# Patient Record
Sex: Female | Born: 1970 | Race: White | Hispanic: No | Marital: Married | Smoking: Never smoker
Health system: Southern US, Community
[De-identification: ages and names within clinical notes are randomized; demographics above are authoritative.]

## PROBLEM LIST (undated history)

## (undated) DIAGNOSIS — G43909 Migraine, unspecified, not intractable, without status migrainosus: Secondary | ICD-10-CM

## (undated) DIAGNOSIS — J45909 Unspecified asthma, uncomplicated: Secondary | ICD-10-CM

## (undated) DIAGNOSIS — M1990 Unspecified osteoarthritis, unspecified site: Secondary | ICD-10-CM

## (undated) DIAGNOSIS — F32A Depression, unspecified: Secondary | ICD-10-CM

## (undated) DIAGNOSIS — F329 Major depressive disorder, single episode, unspecified: Secondary | ICD-10-CM

## (undated) HISTORY — DX: Unspecified asthma, uncomplicated: J45.909

## (undated) HISTORY — PX: OTHER SURGICAL HISTORY: SHX169

## (undated) HISTORY — PX: AUGMENTATION MAMMAPLASTY: SUR837

## (undated) HISTORY — DX: Migraine, unspecified, not intractable, without status migrainosus: G43.909

## (undated) HISTORY — PX: KNEE SURGERY: SHX244

---
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
## 1994-07-23 HISTORY — PX: DILATION AND CURETTAGE OF UTERUS: SHX78

## 1997-11-03 ENCOUNTER — Inpatient Hospital Stay (HOSPITAL_COMMUNITY): Admission: AD | Admit: 1997-11-03 | Discharge: 1997-11-06 | Payer: Self-pay | Admitting: Gynecology

## 1998-12-28 ENCOUNTER — Other Ambulatory Visit: Admission: RE | Admit: 1998-12-28 | Discharge: 1998-12-28 | Payer: Self-pay | Admitting: Obstetrics and Gynecology

## 2000-02-01 ENCOUNTER — Other Ambulatory Visit: Admission: RE | Admit: 2000-02-01 | Discharge: 2000-02-01 | Payer: Self-pay | Admitting: Gynecology

## 2001-02-25 ENCOUNTER — Other Ambulatory Visit: Admission: RE | Admit: 2001-02-25 | Discharge: 2001-02-25 | Payer: Self-pay | Admitting: Obstetrics and Gynecology

## 2001-07-22 ENCOUNTER — Other Ambulatory Visit: Admission: RE | Admit: 2001-07-22 | Discharge: 2001-07-22 | Payer: Self-pay | Admitting: Gynecology

## 2001-07-23 HISTORY — PX: ANKLE SURGERY: SHX546

## 2002-03-26 ENCOUNTER — Other Ambulatory Visit: Admission: RE | Admit: 2002-03-26 | Discharge: 2002-03-26 | Payer: Self-pay | Admitting: Gynecology

## 2002-12-31 ENCOUNTER — Other Ambulatory Visit: Admission: RE | Admit: 2002-12-31 | Discharge: 2002-12-31 | Payer: Self-pay | Admitting: Gynecology

## 2003-07-28 ENCOUNTER — Inpatient Hospital Stay (HOSPITAL_COMMUNITY): Admission: AD | Admit: 2003-07-28 | Discharge: 2003-07-30 | Payer: Self-pay | Admitting: Gynecology

## 2003-09-08 ENCOUNTER — Other Ambulatory Visit: Admission: RE | Admit: 2003-09-08 | Discharge: 2003-09-08 | Payer: Self-pay | Admitting: Gynecology

## 2004-12-04 ENCOUNTER — Other Ambulatory Visit: Admission: RE | Admit: 2004-12-04 | Discharge: 2004-12-04 | Payer: Self-pay | Admitting: Gynecology

## 2005-06-27 ENCOUNTER — Other Ambulatory Visit: Admission: RE | Admit: 2005-06-27 | Discharge: 2005-06-27 | Payer: Self-pay | Admitting: Gynecology

## 2006-03-12 ENCOUNTER — Other Ambulatory Visit: Admission: RE | Admit: 2006-03-12 | Discharge: 2006-03-12 | Payer: Self-pay | Admitting: Gynecology

## 2007-03-25 ENCOUNTER — Other Ambulatory Visit: Admission: RE | Admit: 2007-03-25 | Discharge: 2007-03-25 | Payer: Self-pay | Admitting: Gynecology

## 2008-05-11 ENCOUNTER — Encounter: Payer: Self-pay | Admitting: Gynecology

## 2008-05-11 ENCOUNTER — Ambulatory Visit: Payer: Self-pay | Admitting: Gynecology

## 2008-05-11 ENCOUNTER — Other Ambulatory Visit: Admission: RE | Admit: 2008-05-11 | Discharge: 2008-05-11 | Payer: Self-pay | Admitting: Gynecology

## 2008-06-03 ENCOUNTER — Ambulatory Visit: Payer: Self-pay | Admitting: Gynecology

## 2008-06-07 ENCOUNTER — Ambulatory Visit: Payer: Self-pay | Admitting: Gynecology

## 2008-06-14 ENCOUNTER — Ambulatory Visit: Payer: Self-pay | Admitting: Gynecology

## 2008-06-21 ENCOUNTER — Ambulatory Visit: Payer: Self-pay | Admitting: Gynecology

## 2009-02-06 ENCOUNTER — Inpatient Hospital Stay (HOSPITAL_COMMUNITY): Admission: AD | Admit: 2009-02-06 | Discharge: 2009-02-08 | Payer: Self-pay | Admitting: Obstetrics and Gynecology

## 2010-10-29 LAB — CBC
HCT: 31.8 % — ABNORMAL LOW (ref 36.0–46.0)
MCHC: 33.5 g/dL (ref 30.0–36.0)
MCV: 92.3 fL (ref 78.0–100.0)
Platelets: 214 10*3/uL (ref 150–400)
Platelets: 233 10*3/uL (ref 150–400)
RBC: 3.75 MIL/uL — ABNORMAL LOW (ref 3.87–5.11)
RDW: 13.8 % (ref 11.5–15.5)
RDW: 13.8 % (ref 11.5–15.5)
WBC: 11.1 10*3/uL — ABNORMAL HIGH (ref 4.0–10.5)

## 2010-10-29 LAB — RPR: RPR Ser Ql: NONREACTIVE

## 2010-12-08 NOTE — Discharge Summary (Signed)
Colleen Sloan, Colleen Sloan                      ACCOUNT NO.:  [id number]   MEDICAL RECORD NO.:  [id number]                   PATIENT TYPE:  INP   LOCATION:  9121                                 FACILITY:  [facility]   PHYSICIAN:  Ivor Costa. Farrel Gobble, M.D.              DATE OF BIRTH:  Feb 16, 1971   DATE OF ADMISSION:  07/28/2003  DATE OF DISCHARGE:  07/30/2003                                 DISCHARGE SUMMARY   DISCHARGE DIAGNOSES:  1. Intrauterine pregnancy at 40+ weeks, delivered.  2. Rh negative.  3. Status post spontaneous vaginal delivery.   HISTORY:  This is a 32-years-of-age female gravida 4 para 1 aborta 2 with an  EDC of July 26, 2003.  Prenatal course had been complicated by Rh  negative; the received RhoGAM.   HOSPITAL COURSE:  On July 28, 2003 the patient presented at 40+ weeks in  labor.  Artificial rupture of membranes performed which revealed clear  fluid.  Labor was augmented with Pitocin and on July 28, 2003 the patient  underwent an spontaneous vaginal delivery of a female, Apgars of 8 and 9,  weight of 7 pounds 11 ounces.  There was noted to be a nuchal cord x2 which  was tight.  There was no episiotomy.  There was a second degree midline  laceration as well as bilateral small periurethral/inner labial lacerations  all of which were repaired without complication.  Postpartum the patient  remained afebrile, voiding, in stable condition, and was discharged to home  on July 30, 2003 and given [facility] postpartum instructions.   ACCESSORY CLINICAL FINDINGS/LABORATORY DATA:  The patient is O negative,  rubella immune.  On July 29, 2003 hemoglobin 11.9.   DISPOSITION:  The patient is discharged to home on July 30, 2003 and given  [facility] postpartum instructions and postpartum booklet.  She  had been given RhoGAM prior to discharge.     Susa Loffler, P.A.                    Ivor Costa. Farrel Gobble, M.D.    Ardath Sax  D:  08/30/2003  T:   08/30/2003  Job:  [id number]

## 2011-09-17 ENCOUNTER — Encounter: Payer: Self-pay | Admitting: Gynecology

## 2011-09-17 ENCOUNTER — Ambulatory Visit (INDEPENDENT_AMBULATORY_CARE_PROVIDER_SITE_OTHER): Payer: BC Managed Care – PPO | Admitting: Gynecology

## 2011-09-17 ENCOUNTER — Other Ambulatory Visit (HOSPITAL_COMMUNITY)
Admission: RE | Admit: 2011-09-17 | Discharge: 2011-09-17 | Disposition: A | Discharge status: Home / Self Care | Payer: BC Managed Care – PPO | Source: Ambulatory Visit | Attending: Gynecology | Admitting: Gynecology

## 2011-09-17 VITALS — BP 110/76 | Ht 64.5 in | Wt 164.0 lb

## 2011-09-17 DIAGNOSIS — Z131 Encounter for screening for diabetes mellitus: Secondary | ICD-10-CM

## 2011-09-17 DIAGNOSIS — R5381 Other malaise: Secondary | ICD-10-CM

## 2011-09-17 DIAGNOSIS — R5383 Other fatigue: Secondary | ICD-10-CM

## 2011-09-17 DIAGNOSIS — Z01419 Encounter for gynecological examination (general) (routine) without abnormal findings: Secondary | ICD-10-CM

## 2011-09-17 DIAGNOSIS — G43909 Migraine, unspecified, not intractable, without status migrainosus: Secondary | ICD-10-CM

## 2011-09-17 DIAGNOSIS — Z13220 Encounter for screening for lipoid disorders: Secondary | ICD-10-CM

## 2011-09-17 MED ORDER — FLUOXETINE HCL 20 MG PO CAPS: 20.0000 mg | ORAL_CAPSULE | Freq: Every day | ORAL | Status: DC

## 2011-09-17 NOTE — Patient Instructions (Signed)
Start fluoxetine 20 mg daily.  Call if you have any issues. Otherwise follow up in one year for annual gynecologic exam.

## 2011-09-17 NOTE — Progress Notes (Signed)
   Colleen Sloan 1970-09-14 [id number]        41 y.o.  G5 P3 Ab2 Mirena IUD birth control for annual exam. Complaining of increased fatigue and stress. Apparently has a very stressful job and is really affecting the way she feels. Also being followed for migraines and on several medications for this by her primary.   Past medical history,surgical history, medications, allergies, family history and social history were all reviewed and documented in the EPIC chart. ROS:  Was performed and pertinent positives and negatives are included in the history.  Exam: Sherrilyn Rist chaperone present Filed Vitals:   09/17/11 1228  BP: 110/76   General appearance  Normal Skin grossly normal Head/Neck normal with no cervical or supraclavicular adenopathy thyroid normal Lungs  clear Cardiac RR, without RMG Abdominal  soft, nontender, without masses, organomegaly or hernia Breasts  examined lying and sitting without masses, retractions, discharge or axillary adenopathy. Pelvic  Ext/BUS/vagina  normal   Cervix  normal  Pap done, IUD string visualized  Uterus  anteverted, normal size, shape and contour, midline and mobile nontender   Adnexa  Without masses or tenderness    Anus and perineum  normal   Rectovaginal  normal sphincter tone without palpated masses or tenderness.    Assessment/Plan:  41 y.o. female for annual exam.    1. Stress/fatigue. I think it is situational in her life. We'll check baseline TSH. Options for management were reviewed and she wants to try an anxiolytic. Fluoxetine 20 mg daily refill times a year prescribed. Side effect profile/suicide ideation risks reviewed. Patient accepts. She will let me know if this doesn't seem to help for dosage adjustment or trying a different medication. 2. Mirena IUD. This was placed fall 2010. She is amenorrheic doing well with this and will continue it through the 5 year course. 3. Migraine headaches. She is on several medications for this wants to  see a neurologist. I suggested Dr. Vela Prose and we will help facilitate this. 4. Pap smear. Pap smear was done today. She has no history of significant abnormal Pap smears in the past but we will discuss a less frequent screening next year. 5. Breast health. SBE monthly reviewed. Screening mammographic recommendations between 35 and 40 were reviewed and she wants to go ahead and schedule this and [facility]. 6. Health maintenance. Will check baseline CBC lipid profile urinalysis along with her TSH. I'm also going to check a comp has metabolic panel as she is on medications that may affect liver/renal function and will check this also. Otherwise assuming she continues well but she will see [place] in a year sooner as needed.    Dara Lords MD, 12:54 PM 09/17/2011

## 2011-09-18 LAB — URINALYSIS W MICROSCOPIC + REFLEX CULTURE
Casts: NONE SEEN
Crystals: NONE SEEN
Nitrite: NEGATIVE
Specific Gravity, Urine: 1.028 (ref 1.005–1.030)
Urobilinogen, UA: 0.2 mg/dL (ref 0.0–1.0)
pH: 6.5 (ref 5.0–8.0)

## 2011-09-18 LAB — CBC WITH DIFFERENTIAL/PLATELET
Basophils Relative: 0 % (ref 0–1)
HCT: 40.8 % (ref 36.0–46.0)
Hemoglobin: 13.1 g/dL (ref 12.0–15.0)
Lymphocytes Relative: 24 % (ref 12–46)
Monocytes Absolute: 0.4 10*3/uL (ref 0.1–1.0)
Monocytes Relative: 7 % (ref 3–12)
Neutro Abs: 4.1 10*3/uL (ref 1.7–7.7)
Neutrophils Relative %: 68 % (ref 43–77)
RBC: 4.31 MIL/uL (ref 3.87–5.11)
WBC: 6 10*3/uL (ref 4.0–10.5)

## 2011-09-18 LAB — COMPREHENSIVE METABOLIC PANEL
AST: 17 U/L (ref 0–37)
Albumin: 4.7 g/dL (ref 3.5–5.2)
Alkaline Phosphatase: 62 U/L (ref 39–117)
BUN: 14 mg/dL (ref 6–23)
Calcium: 9.4 mg/dL (ref 8.4–10.5)
Chloride: 104 mEq/L (ref 96–112)
Glucose, Bld: 86 mg/dL (ref 70–99)
Potassium: 4 mEq/L (ref 3.5–5.3)
Sodium: 137 mEq/L (ref 135–145)
Total Protein: 7.2 g/dL (ref 6.0–8.3)

## 2011-09-18 LAB — LIPID PANEL
HDL: 50 mg/dL (ref >39)
LDL Cholesterol: 101 mg/dL — ABNORMAL HIGH (ref 0–99)
Triglycerides: 56 mg/dL (ref <150)

## 2011-09-18 LAB — TSH: TSH: 2.076 u[IU]/mL (ref 0.350–4.500)

## 2011-10-08 ENCOUNTER — Telehealth: Payer: Self-pay | Admitting: *Deleted

## 2011-10-08 NOTE — Telephone Encounter (Signed)
Pt called just recently found out her dad has been diagnosed with genetic disorder hemochromatosis. Her father's doctor suggested that she get tested as well. Pt is still feeling very fatigue and no energy as noted on last OV in feb 2013. Pt didn't know if this is something you could check for or if you have any recommendations of who she should see regarding this? Please advise

## 2011-10-11 NOTE — Telephone Encounter (Signed)
Pt informed with the below note and will call back with the labs to be ordered.

## 2011-10-11 NOTE — Telephone Encounter (Signed)
There are several ways to test for hemochromatosis to include iron studies versus genetic gene testing. I would suggest the patient contact her father's doctor and ask what tests they recommend that she have done and we can order that for her I am really not an expert on hemochromatosis and I prefer if the doctor diagnosed her father with suggest the testing needed.

## 2012-03-31 ENCOUNTER — Telehealth: Payer: Self-pay | Admitting: *Deleted

## 2012-03-31 MED ORDER — VENLAFAXINE HCL ER 75 MG PO CP24: 75.0000 mg | ORAL_CAPSULE | Freq: Every day | ORAL | Status: DC

## 2012-03-31 NOTE — Telephone Encounter (Signed)
Pt informed with the below note. 

## 2012-03-31 NOTE — Telephone Encounter (Signed)
Pt was given Rx for gen. Prozac 20 mg, pt said that she still has mood swings, slight depression and anxiety. Pt also has noticed weight gain on medication. Pt asked if another Rx. Please advise

## 2012-03-31 NOTE — Telephone Encounter (Signed)
Recommend we switch to Effexor XR 75 mg and we'll see how she does with this.  I would stop the Prozac and start the Effexor the next day.

## 2012-04-12 ENCOUNTER — Ambulatory Visit: Payer: Self-pay | Admitting: Orthopedic Surgery

## 2014-02-22 ENCOUNTER — Telehealth: Payer: Self-pay | Admitting: Gynecology

## 2014-02-22 NOTE — Telephone Encounter (Signed)
02/22/14-LM VM on pt cell that her [facility] insurance covers the removal of old Mirena and insertion of new at 100%, no copay. Pt will call to make An appt.wl

## 2014-02-23 ENCOUNTER — Other Ambulatory Visit: Payer: Self-pay | Admitting: Gynecology

## 2014-02-23 DIAGNOSIS — Z3049 Encounter for surveillance of other contraceptives: Secondary | ICD-10-CM

## 2014-02-23 MED ORDER — LEVONORGESTREL 20 MCG/24HR IU IUD: INTRAUTERINE_SYSTEM | Freq: Once | INTRAUTERINE | Status: AC

## 2014-03-16 ENCOUNTER — Ambulatory Visit: Payer: 59 | Admitting: Gynecology

## 2014-03-31 ENCOUNTER — Telehealth: Payer: Self-pay

## 2014-03-31 ENCOUNTER — Other Ambulatory Visit: Payer: Self-pay | Admitting: Gynecology

## 2014-03-31 MED ORDER — ALPRAZOLAM 0.25 MG PO TABS: ORAL_TABLET | ORAL | Status: DC

## 2014-03-31 NOTE — Telephone Encounter (Signed)
Okay for Xanax 0.25 mg #5 one to 2 by mouth one hour before procedure

## 2014-03-31 NOTE — Telephone Encounter (Signed)
error 

## 2014-03-31 NOTE — Telephone Encounter (Signed)
Patient advised to have someone drive her to and from. She said husband will be coming with her and will be driving. Rx sent.

## 2014-03-31 NOTE — Telephone Encounter (Signed)
Patient is coming in Friday for removal of IUD and insertion of new one. Very anxious about it as she has heard stories of how painful it can be. She wondered if you would prescribed something for her to take for anxiety prior to appointment?

## 2014-04-02 ENCOUNTER — Ambulatory Visit (INDEPENDENT_AMBULATORY_CARE_PROVIDER_SITE_OTHER): Payer: 59 | Admitting: Gynecology

## 2014-04-02 ENCOUNTER — Encounter: Payer: Self-pay | Admitting: Gynecology

## 2014-04-02 DIAGNOSIS — Z30430 Encounter for insertion of intrauterine contraceptive device: Secondary | ICD-10-CM

## 2014-04-02 HISTORY — PX: INTRAUTERINE DEVICE INSERTION: SHX323

## 2014-04-02 NOTE — Progress Notes (Signed)
Patient presents for Mirena IUD removal and replacement. She has read through the booklet, has no contraindications and signed the consent form.  I reviewed the removal and insertional process with her as well as the risks to include infection, either immediate or long-term, uterine perforation or migration requiring surgery to remove, other complications such as pain, hormonal side effects and the possibility of failure with subsequent pregnancy.   Exam with Sharrie Rothman assistant Pelvic: External BUS vagina normal. Cervix normal with IUD string at external os. Uterus anteverted normal size shape contour midline mobile nontender. Adnexa without masses or tenderness.  Procedure: The cervix was visualized and the IUD string was grasped with a [place] forcep and the old IUD was removed, shown to the patient and discarded. The cervix was then cleansed with Betadine, anterior lip grasped with a single-tooth tenaculum, the uterus was sounded and a Mirena IUD was placed according to manufacturer's recommendations without difficulty. The strings were trimmed. The patient tolerated well and will follow up in 2 weeks at her scheduled annual exam for a postinsertional check.  Lot number:  [id number]  Note: This document was prepared with digital dictation and possible smart phrase technology. Any transcriptional errors that result from this process are unintentional.  [name] MD, 3:04 PM 04/02/2014

## 2014-04-02 NOTE — Patient Instructions (Signed)
Intrauterine Device Insertion Most often, an intrauterine device (IUD) is inserted into the uterus to prevent pregnancy. There are 2 types of IUDs available:  Copper IUD--This type of IUD creates an environment that is not favorable to sperm survival. The mechanism of action of the copper IUD is not known for certain. It can stay in place for 10 years.  Hormone IUD--This type of IUD contains the hormone progestin (synthetic progesterone). The progestin thickens the cervical mucus and prevents sperm from entering the uterus, and it also thins the uterine lining. There is no evidence that the hormone IUD prevents implantation. One hormone IUD can stay in place for up to 5 years, and a different hormone IUD can stay in place for up to 3 years. An IUD is the most cost-effective birth control if left in place for the full duration. It may be removed at any time. LET YOUR HEALTH CARE PROVIDER KNOW ABOUT:  Any allergies you have.  All medicines you are taking, including vitamins, herbs, eye drops, creams, and over-the-counter medicines.  Previous problems you or members of your family have had with the use of anesthetics.  Any blood disorders you have.  Previous surgeries you have had.  Possibility of pregnancy.  Medical conditions you have. RISKS AND COMPLICATIONS  Generally, intrauterine device insertion is a safe procedure. However, as with any procedure, complications can occur. Possible complications include:  Accidental puncture (perforation) of the uterus.  Accidental placement of the IUD either in the muscle layer of the uterus (myometrium) or outside the uterus. If this happens, the IUD can be found essentially floating around the bowels and must be taken out surgically.  The IUD may fall out of the uterus (expulsion). This is more common in women who have recently had a child.   Pregnancy in the fallopian tube (ectopic).  Pelvic inflammatory disease (PID), which is infection of  the uterus and fallopian tubes. The risk of PID is slightly increased in the first 20 days after the IUD is placed, but the overall risk is still very low. BEFORE THE PROCEDURE  Schedule the IUD insertion for when you will have your menstrual period or right after, to make sure you are not pregnant. Placement of the IUD is better tolerated shortly after a menstrual cycle.  You may need to take tests or be examined to make sure you are not pregnant.  You may be required to take a pregnancy test.  You may be required to get checked for sexually transmitted infections (STIs) prior to placement. Placing an IUD in someone who has an infection can make the infection worse.  You may be given a pain reliever to take 1 or 2 hours before the procedure.  An exam will be performed to determine the size and position of your uterus.  Ask your health care provider about changing or stopping your regular medicines. PROCEDURE   A tool (speculum) is placed in the vagina. This allows your health care provider to see the lower part of the uterus (cervix).  The cervix is prepped with a medicine that lowers the risk of infection.  You may be given a medicine to numb each side of the cervix (intracervical or paracervical block). This is used to block and control any discomfort with insertion.  A tool (uterine sound) is inserted into the uterus to determine the length of the uterine cavity and the direction the uterus may be tilted.  A slim instrument (IUD inserter) is inserted through the cervical   canal and into your uterus.  The IUD is placed in the uterine cavity and the insertion device is removed.  The nylon string that is attached to the IUD and used for eventual IUD removal is trimmed. It is trimmed so that it lays high in the vagina, just outside the cervix. AFTER THE PROCEDURE  You may have bleeding after the procedure. This is normal. It varies from light spotting for a few days to menstrual-like  bleeding.  You may have mild cramping. Document Released: 03/07/2011 Document Revised: 04/29/2013 Document Reviewed: 12/28/2012 ExitCare Patient Information 2015 [employer]. This information is not intended to replace advice given to you by your health care provider. Make sure you discuss any questions you have with your health care provider.  

## 2014-04-05 ENCOUNTER — Encounter: Payer: Self-pay | Admitting: Gynecology

## 2014-04-14 ENCOUNTER — Encounter: Payer: Self-pay | Admitting: Gynecology

## 2014-05-24 ENCOUNTER — Encounter: Payer: Self-pay | Admitting: Gynecology

## 2014-05-26 ENCOUNTER — Encounter: Payer: Self-pay | Admitting: Gynecology

## 2014-05-26 ENCOUNTER — Encounter: Payer: Self-pay | Admitting: Internal Medicine

## 2014-05-26 ENCOUNTER — Ambulatory Visit (INDEPENDENT_AMBULATORY_CARE_PROVIDER_SITE_OTHER): Payer: 59 | Admitting: Internal Medicine

## 2014-05-26 VITALS — BP 110/70 | HR 64 | Temp 98.7°F | Ht 64.0 in | Wt 159.8 lb

## 2014-05-26 DIAGNOSIS — R05 Cough: Secondary | ICD-10-CM

## 2014-05-26 DIAGNOSIS — R058 Other specified cough: Secondary | ICD-10-CM

## 2014-05-26 MED ORDER — PANTOPRAZOLE SODIUM 40 MG PO TBEC: 40.0000 mg | DELAYED_RELEASE_TABLET | Freq: Every day | ORAL | Status: DC

## 2014-05-26 MED ORDER — PREDNISONE 10 MG PO TABS: ORAL_TABLET | ORAL | Status: DC

## 2014-05-26 MED ORDER — FAMOTIDINE 20 MG PO TABS: ORAL_TABLET | ORAL | Status: DC

## 2014-05-26 MED ORDER — CEFDINIR 300 MG PO CAPS: 300.0000 mg | ORAL_CAPSULE | Freq: Two times a day (BID) | ORAL | Status: DC

## 2014-05-26 MED ORDER — TRAMADOL HCL 50 MG PO TABS: ORAL_TABLET | ORAL | Status: DC

## 2014-05-26 NOTE — Patient Instructions (Addendum)
The key to effective treatment for your cough is eliminating the non-stop cycle of cough you're stuck in long enough to let your airway heal completely and then see if there is anything still making you cough once you stop the cough suppression, but this should take no more than 5 days to figure out  First take delsym two tsp every 12 hours and supplement if needed with  tramadol 50 mg up to 2 every 4 hours to suppress the urge to cough at all or even clear your throat. Swallowing water or using ice chips/non mint and menthol containing candies (such as lifesavers or sugarless jolly ranchers) are also effective.  You should rest your voice and avoid activities that you know make you cough.  Once you have eliminated the cough for 3 straight days try reducing the tramadol first,  then the delsym as tolerated.    Prednisone 10 mg take  4 each am x 2 days,   2 each am x 2 days,  1 each am x 2 days and stop (this is to eliminate allergies and inflammation from coughing)  Protonix (pantoprazole) Take 30-60 min before first meal of the day and Pepcid 20 mg one bedtime  until cough is completely gone for at least a week without the need for cough suppression  GERD (REFLUX)  is an extremely common cause of respiratory symptoms, many times with no significant heartburn at all.    It can be treated with medication, but also with lifestyle changes including avoidance of late meals, excessive alcohol, smoking cessation, and avoid fatty foods, chocolate, peppermint, colas, red wine, and acidic juices such as orange juice.  NO MINT OR MENTHOL PRODUCTS SO NO COUGH DROPS  USE HARD CANDY INSTEAD (jolley ranchers or Stover's or Lifesavers (all available in sugarless versions) NO OIL BASED VITAMINS - use powdered substitutes.  Return 2 weeks if not better with cxr

## 2014-05-26 NOTE — Progress Notes (Signed)
Subjective:    Patient ID: Colleen Sloan, female    DOB: 02-27-1971, 43 y.o.   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 Pulmonary Consult    Self referral. Pt c/o cough and SOB for the past 3 wks. She is coughing up clear sputum.   baseline = singulair x one year s apparent benefit and saba when exp to cig smoke or pollen with sob/cough not tending to be at hs but then 4 weeks prior to OV   sensation of pnds/ sore throat / hoarseness  rx added doxy/ flonase/ dulera but ran out 5 day prior to OV   Coughs so hard gags/ no vomit. Mucus is yellow to clear    Prev allergy eval pos "sinus impaction" for trees March 2015 ragweed/ mold > / rx prednisone / advair but stopped not sure it helped  No obvious other patterns in day to day or daytime variabilty or assoc sob unless coughing  cp or chest tightness, subjective wheeze overt sinus or hb symptoms. No unusual exp hx or h/o childhood pna/ asthma or knowledge of premature birth.  Sleeping ok without nocturnal  or early am exacerbation  of respiratory  c/o's or need for noct saba. Also denies any obvious fluctuation of symptoms with weather or environmental changes or other aggravating or alleviating factors except as outlined above   Current Medications, Allergies, Complete Past Medical History, Past Surgical History, Family History, and Social History were reviewed in [employer] record.            Review of Systems  Constitutional: Negative for fever, chills and unexpected weight change.  HENT: Positive for ear pain and sore throat. Negative for  congestion, dental problem, nosebleeds, postnasal drip, rhinorrhea, sinus pressure, sneezing, trouble swallowing and voice change.   Eyes: Negative for visual disturbance.  Respiratory: Positive for cough and shortness of breath. Negative for choking.   Cardiovascular: Negative for chest pain and leg swelling.  Gastrointestinal: Negative for vomiting, abdominal pain and diarrhea.  Genitourinary: Negative for difficulty urinating.  Musculoskeletal: Negative for arthralgias.  Skin: Negative for rash.  Neurological: Negative for tremors, syncope and headaches.  Hematological: Does not bruise/bleed easily.       Objective:   Physical Exam   amb wf nad until starts a violent honking upper airway cough pattern  Wt Readings from Last 3 Encounters:  05/26/14 159 lb 12.8 oz (72.485 kg)  09/17/11 164 lb (74.39 kg)    Vital signs reviewed    HEENT: nl dentition, turbinates, and orophanx.  ear canals impacted bilaterally   without cough reflex   NECK :  without JVD/Nodes/TM/ nl carotid upstrokes bilaterally   LUNGS: no acc muscle use, clear to A and P bilaterally without cough on insp  maneuvers   CV:  RRR  no s3 or murmur or increase in P2, no edema   ABD:  soft and nontender with nl excursion in the supine position. No bruits or organomegaly, bowel sounds nl  MS:  warm without deformities, calf tenderness, cyanosis or clubbing  SKIN: warm and dry without lesions  NEURO:  alert, approp, no deficits           Assessment & Plan:

## 2014-05-27 NOTE — Assessment & Plan Note (Signed)

## 2014-05-28 ENCOUNTER — Telehealth: Payer: Self-pay | Admitting: Internal Medicine

## 2014-05-28 ENCOUNTER — Ambulatory Visit: Payer: 59 | Admitting: Adult Health

## 2014-05-28 NOTE — Telephone Encounter (Signed)
Called and spoke to pt. Appt made with TP today, 05/28/14, at 4:15. Pt verbalized understanding and denied any further questions or concerns at this time.

## 2014-05-28 NOTE — Telephone Encounter (Signed)
If taking 2 tramadol every 4 hours and still coughing will need to be worked in this afternoon to see me or Tammy NP to regroup with cxr also

## 2014-05-28 NOTE — Telephone Encounter (Signed)
LMTCB for pt 

## 2014-05-28 NOTE — Telephone Encounter (Signed)
lmtcb x1 

## 2014-05-28 NOTE — Telephone Encounter (Signed)
Pt returned call. Pt stated she feels her cough is getting worse. Pt c/o hoarseness and worsening cough. Pt states overall she "feels better" but her cough is the only issue. States she is taking delsym and tramadol routinely and also taking her PPI and prednisone as prescribed. Pt requesting further recs by MW. Pt last seen by MW on 11/4.  MW please advise.   Allergies  Allergen Reactions  . Amoxicillin-Pot Clavulanate Anaphylaxis

## 2014-06-09 ENCOUNTER — Ambulatory Visit: Payer: 59 | Admitting: Internal Medicine

## 2014-07-30 ENCOUNTER — Encounter: Payer: Self-pay | Admitting: Gynecology

## 2014-07-30 ENCOUNTER — Ambulatory Visit (INDEPENDENT_AMBULATORY_CARE_PROVIDER_SITE_OTHER): Payer: 59 | Admitting: Gynecology

## 2014-07-30 VITALS — BP 118/66 | Ht 64.5 in | Wt 168.0 lb

## 2014-07-30 DIAGNOSIS — M545 Low back pain, unspecified: Secondary | ICD-10-CM

## 2014-07-30 DIAGNOSIS — Z30431 Encounter for routine checking of intrauterine contraceptive device: Secondary | ICD-10-CM

## 2014-07-30 DIAGNOSIS — Z01419 Encounter for gynecological examination (general) (routine) without abnormal findings: Secondary | ICD-10-CM

## 2014-07-30 MED ORDER — CYCLOBENZAPRINE HCL 10 MG PO TABS: 10.0000 mg | ORAL_TABLET | Freq: Three times a day (TID) | ORAL | Status: DC | PRN

## 2014-07-30 NOTE — Addendum Note (Signed)
Addended by: Joaquin Music on: 07/30/2014 03:29 PM   Modules accepted: Orders

## 2014-07-30 NOTE — Addendum Note (Signed)
Addended by: Nelva Nay on: 07/30/2014 03:41 PM   Modules accepted: Orders, SmartSet

## 2014-07-30 NOTE — Patient Instructions (Signed)
Call to Schedule your mammogram  Facilities in [place]: 1)  [facility], [address] Phone: [phone] 2)  [facility]. [facility], [address] Phone: [phone] 3)  Dr[facility] Bertrand at [facility]  [address] Phone: [phone]     Mammogram A mammogram is an X-ray test to find changes in a woman's breast. You should get a mammogram if:  You are 40 years of age or older  You have risk factors.   Your doctor recommends that you have one.  BEFORE THE TEST  Do not schedule the test the week before your period, especially if your breasts are sore during this time.  On the day of your mammogram:  Wash your breasts and armpits well. After washing, do not put on any deodorant or talcum powder on until after your test.   Eat and drink as you usually do.   Take your medicines as usual.   If you are diabetic and take insulin, make sure you:   Eat before coming for your test.   Take your insulin as usual.   If you cannot keep your appointment, call before the appointment to cancel. Schedule another appointment.  TEST  You will need to undress from the waist up. You will put on a hospital gown.   Your breast will be put on the mammogram machine, and it will press firmly on your breast with a piece of plastic called a compression paddle. This will make your breast flatter so that the machine can X-ray all parts of your breast.   Both breasts will be X-rayed. Each breast will be X-rayed from above and from the side. An X-ray might need to be taken again if the picture is not good enough.   The mammogram will last about 15 to 30 minutes.  AFTER THE TEST Finding out the results of your test Ask when your test results will be ready. Make sure you get your test results.  Document Released: 10/05/2008 Document Revised: 06/28/2011 Document Reviewed: 10/05/2008 Exit[employer] Patient  Information 2012 [employer].   You may obtain a copy of any labs that were done today by logging onto MyChart as outlined in the instructions provided with your AVS (after visit summary). The office will not call with normal lab results but certainly if there are any significant abnormalities then we will contact you.   Health Maintenance, Female A healthy lifestyle and preventative care can promote health and wellness.  Maintain regular health, dental, and eye exams.  Eat a healthy diet. Foods like vegetables, fruits, whole grains, low-fat dairy products, and lean protein foods contain the nutrients you need without too many calories. Decrease your intake of foods high in solid fats, added sugars, and salt. Get information about a proper diet from your caregiver, if necessary.  Regular physical exercise is one of the most important things you can do for your health. Most adults should get at least 150 minutes of moderate-intensity exercise (any activity that increases your heart rate and causes you to sweat) each week. In addition, most adults need muscle-strengthening exercises on 2 or more days a week.   Maintain a healthy weight. The body mass index (BMI) is a screening tool to identify possible weight problems. It provides an estimate of body fat based on height and weight. Your caregiver can help determine your BMI, and can help you achieve or maintain a healthy weight. For   adults 20 years and older:  A BMI below 18.5 is considered underweight.  A BMI of 18.5 to 24.9 is normal.  A BMI of 25 to 29.9 is considered overweight.  A BMI of 30 and above is considered obese.  Maintain normal blood lipids and cholesterol by exercising and minimizing your intake of saturated fat. Eat a balanced diet with plenty of fruits and vegetables. Blood tests for lipids and cholesterol should begin at age 20 and be repeated every 5 years. If your lipid or cholesterol levels are high, you are over 50,  or you are a high risk for heart disease, you may need your cholesterol levels checked more frequently.Ongoing high lipid and cholesterol levels should be treated with medicines if diet and exercise are not effective.  If you smoke, find out from your caregiver how to quit. If you do not use tobacco, do not start.  Lung cancer screening is recommended for adults aged 55 80 years who are at high risk for developing lung cancer because of a history of smoking. Yearly low-dose computed tomography (CT) is recommended for people who have at least a 30-pack-year history of smoking and are a current smoker or have quit within the past 15 years. A pack year of smoking is smoking an average of 1 pack of cigarettes a day for 1 year (for example: 1 pack a day for 30 years or 2 packs a day for 15 years). Yearly screening should continue until the smoker has stopped smoking for at least 15 years. Yearly screening should also be stopped for people who develop a health problem that would prevent them from having lung cancer treatment.  If you are pregnant, do not drink alcohol. If you are breastfeeding, be very cautious about drinking alcohol. If you are not pregnant and choose to drink alcohol, do not exceed 1 drink per day. One drink is considered to be 12 ounces (355 mL) of beer, 5 ounces (148 mL) of wine, or 1.5 ounces (44 mL) of liquor.  Avoid use of street drugs. Do not share needles with anyone. Ask for help if you need support or instructions about stopping the use of drugs.  High blood pressure causes heart disease and increases the risk of stroke. Blood pressure should be checked at least every 1 to 2 years. Ongoing high blood pressure should be treated with medicines, if weight loss and exercise are not effective.  If you are 55 to 42 years old, ask your caregiver if you should take aspirin to prevent strokes.  Diabetes screening involves taking a blood sample to check your fasting blood sugar level. This  should be done once every 3 years, after age 45, if you are within normal weight and without risk factors for diabetes. Testing should be considered at a younger age or be carried out more frequently if you are overweight and have at least 1 risk factor for diabetes.  Breast cancer screening is essential preventative care for women. You should practice "breast self-awareness." This means understanding the normal appearance and feel of your breasts and may include breast self-examination. Any changes detected, no matter how small, should be reported to a caregiver. Women in their 20s and 30s should have a clinical breast exam (CBE) by a caregiver as part of a regular health exam every 1 to 3 years. After age 40, women should have a CBE every year. Starting at age 40, women should consider having a mammogram (breast X-ray) every year. Women who have   a family history of breast cancer should talk to their caregiver about genetic screening. Women at a high risk of breast cancer should talk to their caregiver about having an MRI and a mammogram every year.  Breast cancer gene (BRCA)-related cancer risk assessment is recommended for women who have family members with BRCA-related cancers. BRCA-related cancers include breast, ovarian, tubal, and peritoneal cancers. Having family members with these cancers may be associated with an increased risk for harmful changes (mutations) in the breast cancer genes BRCA1 and BRCA2. Results of the assessment will determine the need for genetic counseling and BRCA1 and BRCA2 testing.  The Pap test is a screening test for cervical cancer. Women should have a Pap test starting at age 21. Between ages 21 and 29, Pap tests should be repeated every 2 years. Beginning at age 30, you should have a Pap test every 3 years as long as the past 3 Pap tests have been normal. If you had a hysterectomy for a problem that was not cancer or a condition that could lead to cancer, then you no longer  need Pap tests. If you are between ages 65 and 70, and you have had normal Pap tests going back 10 years, you no longer need Pap tests. If you have had past treatment for cervical cancer or a condition that could lead to cancer, you need Pap tests and screening for cancer for at least 20 years after your treatment. If Pap tests have been discontinued, risk factors (such as a new sexual partner) need to be reassessed to determine if screening should be resumed. Some women have medical problems that increase the chance of getting cervical cancer. In these cases, your caregiver may recommend more frequent screening and Pap tests.  The human papillomavirus (HPV) test is an additional test that may be used for cervical cancer screening. The HPV test looks for the virus that can cause the cell changes on the cervix. The cells collected during the Pap test can be tested for HPV. The HPV test could be used to screen women aged 30 years and older, and should be used in women of any age who have unclear Pap test results. After the age of 30, women should have HPV testing at the same frequency as a Pap test.  Colorectal cancer can be detected and often prevented. Most routine colorectal cancer screening begins at the age of 50 and continues through age 75. However, your caregiver may recommend screening at an earlier age if you have risk factors for colon cancer. On a yearly basis, your caregiver may provide home test kits to check for hidden blood in the stool. Use of a small camera at the end of a tube, to directly examine the colon (sigmoidoscopy or colonoscopy), can detect the earliest forms of colorectal cancer. Talk to your caregiver about this at age 50, when routine screening begins. Direct examination of the colon should be repeated every 5 to 10 years through age 75, unless early forms of pre-cancerous polyps or small growths are found.  Hepatitis C blood testing is recommended for all people born from 1945  through 1965 and any individual with known risks for hepatitis C.  Practice safe sex. Use condoms and avoid high-risk sexual practices to reduce the spread of sexually transmitted infections (STIs). Sexually active women aged 25 and younger should be checked for Chlamydia, which is a common sexually transmitted infection. Older women with new or multiple partners should also be tested for Chlamydia. Testing   for other STIs is recommended if you are sexually active and at increased risk.  Osteoporosis is a disease in which the bones lose minerals and strength with aging. This can result in serious bone fractures. The risk of osteoporosis can be identified using a bone density scan. Women ages 53 and over and women at risk for fractures or osteoporosis should discuss screening with their caregivers. Ask your caregiver whether you should be taking a calcium supplement or vitamin D to reduce the rate of osteoporosis.  Menopause can be associated with physical symptoms and risks. Hormone replacement therapy is available to decrease symptoms and risks. You should talk to your caregiver about whether hormone replacement therapy is right for you.  Use sunscreen. Apply sunscreen liberally and repeatedly throughout the day. You should seek shade when your shadow is shorter than you. Protect yourself by wearing long sleeves, pants, a wide-brimmed hat, and sunglasses year round, whenever you are outdoors.  Notify your caregiver of new moles or changes in moles, especially if there is a change in shape or color. Also notify your caregiver if a mole is larger than the size of a pencil eraser.  Stay current with your immunizations. Document Released: 01/22/2011 Document Revised: 11/03/2012 Document Reviewed: 01/22/2011 [facility] Patient Information 2014 Center.

## 2014-07-30 NOTE — Progress Notes (Signed)
Colleen Sloan 1971/06/23 [id number]        44 y.o.  [id number] for annual exam.  Doing well. Several issues noted below.  Past medical history,surgical history, problem list, medications, allergies, family history and social history were all reviewed and documented as reviewed in the EPIC chart.  ROS:  Performed with pertinent positives and negatives included in the history, assessment and plan.   Additional significant findings :  none   Exam: cam assistant Filed Vitals:   07/30/14 1450  BP: 118/66  Height: 5' 4.5" (1.638 m)  Weight: 168 lb (76.204 kg)   General appearance:  Normal affect, orientation and appearance. Skin: Grossly normal HEENT: Without gross lesions.  No cervical or supraclavicular adenopathy. Thyroid normal.  Spine straight without deformity or muscle spasm Lungs:  Clear without wheezing, rales or rhonchi Cardiac: RR, without RMG Abdominal:  Soft, nontender, without masses, guarding, rebound, organomegaly or hernia Breasts:  Examined lying and sitting without masses, retractions, discharge or axillary adenopathy. Pelvic:  Ext/BUS/vagina normal  Cervix normal with IUD strings visualized. Pap/HPV  Uterus anteverted, normal size, shape and contour, midline and mobile nontender   Adnexa  Without masses or tenderness    Anus and perineum  Normal   Rectovaginal  Normal sphincter tone without palpated masses or tenderness.    Assessment/Plan:  44 y.o. [id number] female for annual exam without menses, Mirena IUD.   1. Mirena IUD 03/2014.  Doing well without menses. IUD strings visualized. 2. Low back pain. Was lifting something heavy and now has nagging low back pain. Exam is normal. Has been using 800 mg Motrin and heat. Will add Flexeril 10 mg at bedtime #30 no refill. Will follow up with orthopedics if continues. 3. Mammography never. Names and numbers given an patients going to call and schedule her mammogram. SBE monthly reviewed. 4. Pap smear 08/2011. Pap/HPV  today.  No history of significant abnormal Pap smears. 5. Health maintenance. Baseline CBC comprehensive metabolic panel lipid profile urinalysis ordered. Follow up in one year, sooner as needed.    [name] MD, 3:19 PM 07/30/2014

## 2014-09-27 ENCOUNTER — Telehealth: Payer: Self-pay | Admitting: Internal Medicine

## 2014-09-27 DIAGNOSIS — R05 Cough: Secondary | ICD-10-CM

## 2014-09-27 DIAGNOSIS — R058 Other specified cough: Secondary | ICD-10-CM

## 2014-09-27 MED ORDER — TRAMADOL HCL 50 MG PO TABS: 50.0000 mg | ORAL_TABLET | ORAL | Status: DC | PRN

## 2014-09-27 MED ORDER — PREDNISONE 10 MG PO TABS: ORAL_TABLET | ORAL | Status: DC

## 2014-09-27 NOTE — Telephone Encounter (Signed)
Rx's have been called/sent in per MW. Colleen Sloan has been scheduled for 10/11/14 at 2:30pm. Nothing further was needed.

## 2014-09-27 NOTE — Telephone Encounter (Signed)
Spoke with pt, c/o nonprod cough, PND, SOB with exertion, hoarseness X1.5 weeks.  Is requesting recs. States whatever she was given last time worked well for her.  According to last ov note from 05/2014 this was tramadol, pred taper, and delsym. Uses [facility] in [place].   MW please advise on recs.  Thanks!

## 2014-09-27 NOTE — Telephone Encounter (Signed)
Fine to repeat but then set up ov in 2 weeks with me or Tammy so see what we can do differently to keep her better

## 2014-09-30 ENCOUNTER — Ambulatory Visit: Payer: Self-pay | Admitting: Internal Medicine

## 2014-10-11 ENCOUNTER — Ambulatory Visit: Payer: Self-pay | Admitting: Internal Medicine

## 2014-10-26 ENCOUNTER — Ambulatory Visit: Payer: Self-pay | Admitting: Internal Medicine

## 2014-12-24 ENCOUNTER — Other Ambulatory Visit: Payer: Self-pay | Admitting: Family Medicine

## 2014-12-24 DIAGNOSIS — G43909 Migraine, unspecified, not intractable, without status migrainosus: Secondary | ICD-10-CM

## 2014-12-30 DIAGNOSIS — J309 Allergic rhinitis, unspecified: Secondary | ICD-10-CM | POA: Insufficient documentation

## 2014-12-30 DIAGNOSIS — G4700 Insomnia, unspecified: Secondary | ICD-10-CM | POA: Insufficient documentation

## 2014-12-30 DIAGNOSIS — J45909 Unspecified asthma, uncomplicated: Secondary | ICD-10-CM | POA: Insufficient documentation

## 2014-12-30 DIAGNOSIS — F410 Panic disorder [episodic paroxysmal anxiety] without agoraphobia: Secondary | ICD-10-CM | POA: Insufficient documentation

## 2015-01-03 ENCOUNTER — Ambulatory Visit (INDEPENDENT_AMBULATORY_CARE_PROVIDER_SITE_OTHER): Payer: 59 | Admitting: Family Medicine

## 2015-01-03 ENCOUNTER — Encounter: Payer: Self-pay | Admitting: Family Medicine

## 2015-01-03 VITALS — BP 110/70 | HR 84 | Temp 98.6°F | Resp 16 | Ht 64.0 in | Wt 160.0 lb

## 2015-01-03 DIAGNOSIS — F410 Panic disorder [episodic paroxysmal anxiety] without agoraphobia: Secondary | ICD-10-CM

## 2015-01-03 DIAGNOSIS — G4700 Insomnia, unspecified: Secondary | ICD-10-CM | POA: Diagnosis not present

## 2015-01-03 DIAGNOSIS — G43909 Migraine, unspecified, not intractable, without status migrainosus: Secondary | ICD-10-CM | POA: Diagnosis not present

## 2015-01-03 MED ORDER — OXYCODONE-ACETAMINOPHEN 5-325 MG PO TABS: 1.0000 | ORAL_TABLET | ORAL | Status: DC | PRN

## 2015-01-03 NOTE — Progress Notes (Signed)
Subjective:    Patient ID: Colleen Sloan, female    DOB: [birth year], 44 y.o.   MRN: [id number]  HPI Comments: Pt currently taking Lexapro 10mg  and Xanax 0.5mg  po for anxiety/ panic attacks.  Anxiety Presents for follow-up visit. Symptoms include chest pain (During panic attacks. Pt states this happen "a couple times a week"), excessive worry, insomnia (Ambien helps this), irritability, panic and restlessness. Patient reports no compulsions, confusion, decreased concentration, depressed mood, dizziness, dry mouth, feeling of choking, hyperventilation, malaise, muscle tension, nausea, nervous/anxious behavior, obsessions, palpitations, shortness of breath or suicidal ideas. Symptoms occur occasionally. The most recent episode lasted 30 minutes (Improved with medications). The severity of symptoms is mild. The patient sleeps 7 hours (With Ambien) per night. The quality of sleep is good (With AMbien).   Compliance with medications is 76-100%.   Is taking Xanax for anxiety as needed also. Does feel that Lexapro is helping her worrying.    Cutting back on migraine medication.  Is trying to taper off Oxycodone.   Does need 4 or so when has a migraine.   BP 110/70 mmHg  Pulse 84  Temp(Src) 98.6 F (37 C) (Oral)  Resp 16  Ht 5\' 4"  (1.626 m)  Wt 160 lb (72.576 kg)  BMI 27.45 kg/m2   Past Medical History  Diagnosis Date  . Migraines     TAKES TYLOX PRN  . Vaginal delivery 2010    [facility]  . Asthma    Past Surgical History  Procedure Laterality Date  . Hand surgery  1993  . Dilation and curettage of uterus  1996  . Knee surgery  1999, 2001    X 2 Removal of Cysts  . Intrauterine device insertion  04/02/2014    MIRENA  . Ankle surgery Right 2003    Reconstruct Ligaments  . Thumb surg      reports that she has never smoked. She has never used smokeless tobacco. She reports that she does not drink alcohol or use illicit drugs. family history includes Anxiety  disorder in her mother; Asthma in her maternal grandmother; Bipolar disorder in her maternal aunt and sister; Cancer in her maternal aunt and paternal grandmother; Depression in her mother; Hemachromatosis in her father; Hypertension in her father; Mental illness in her sister; Other in her maternal grandmother. Allergies  Allergen Reactions  . Amoxicillin-Pot Clavulanate Anaphylaxis       Review of Systems  Constitutional: Positive for irritability.  Respiratory: Negative for shortness of breath.   Cardiovascular: Positive for chest pain (During panic attacks. Pt states this happen "a couple times a week"). Negative for palpitations.  Gastrointestinal: Negative for nausea.  Neurological: Negative for dizziness.  Psychiatric/Behavioral: Negative for suicidal ideas, confusion and decreased concentration. The patient has insomnia (Ambien helps this). The patient is not nervous/anxious.        Objective:   Physical Exam  Constitutional: She is oriented to person, place, and time. She appears well-developed and well-nourished.  Neurological: She is alert and oriented to person, place, and time.  Psychiatric: She has a normal mood and affect. Her behavior is normal. Judgment and thought content normal.    BP 110/70 mmHg  Pulse 84  Temp(Src) 98.6 F (37 C) (Oral)  Resp 16  Ht 5\' 4"  (1.626 m)  Wt 160 lb (72.576 kg)  BMI 27.45 kg/m2       Assessment & Plan:   1. Migraine without status migrainosus, not intractable, unspecified migraine type  Still requiring  frequent medication, but trying to cut down more.  Has previously seen neurology with no help with her symptoms.   2. Cannot sleep Doing well on Ambien.   3. Panic attack  And anxiety is well controlled on her Lexapro and Alprazolam.  Will continue current medication and follow up as needed.   Margarita Rana, MD

## 2015-01-12 ENCOUNTER — Other Ambulatory Visit: Payer: Self-pay

## 2015-01-12 DIAGNOSIS — F410 Panic disorder [episodic paroxysmal anxiety] without agoraphobia: Secondary | ICD-10-CM

## 2015-01-12 DIAGNOSIS — G4700 Insomnia, unspecified: Secondary | ICD-10-CM

## 2015-01-12 MED ORDER — ALPRAZOLAM 0.5 MG PO TABS: 0.5000 mg | ORAL_TABLET | Freq: Two times a day (BID) | ORAL | Status: DC | PRN

## 2015-01-12 MED ORDER — ZOLPIDEM TARTRATE 10 MG PO TABS: 10.0000 mg | ORAL_TABLET | Freq: Every day | ORAL | Status: DC

## 2015-01-17 ENCOUNTER — Other Ambulatory Visit: Payer: Self-pay | Admitting: Family Medicine

## 2015-01-17 DIAGNOSIS — F410 Panic disorder [episodic paroxysmal anxiety] without agoraphobia: Secondary | ICD-10-CM

## 2015-01-17 MED ORDER — ALPRAZOLAM 0.5 MG PO TABS: 0.5000 mg | ORAL_TABLET | Freq: Two times a day (BID) | ORAL | Status: DC | PRN

## 2015-01-17 NOTE — Telephone Encounter (Signed)
Pt contacted office for refill request on the following medications: Xanax 0.5 mg 90 day supply to [facility] Pt stated that [facility] advised pt to let our office know that they did receive the RX for the medication but because it didn't have the quantity on the RX they would like [place] to send a new RX to them today if possible. Thanks TNP

## 2015-01-17 NOTE — Telephone Encounter (Signed)
Printed, please fax or call in to pharmacy. Thank you.   

## 2015-02-02 ENCOUNTER — Other Ambulatory Visit: Payer: Self-pay | Admitting: Family Medicine

## 2015-02-02 DIAGNOSIS — G43009 Migraine without aura, not intractable, without status migrainosus: Secondary | ICD-10-CM

## 2015-02-02 MED ORDER — OXYCODONE-ACETAMINOPHEN 5-325 MG PO TABS: 1.0000 | ORAL_TABLET | ORAL | Status: DC | PRN

## 2015-02-02 NOTE — Telephone Encounter (Signed)
Prescription printed. Please notify patient it is ready for pick up. Thanks- Dr. Jadavion Spoelstra.  

## 2015-02-02 NOTE — Telephone Encounter (Signed)
Pt contacted office for refill request on the following medications:  oxyCODONE-acetaminophen (PERCOCET/ROXICET) 5-325 MG.  CB#[phone]/[phone]

## 2015-03-03 ENCOUNTER — Other Ambulatory Visit: Payer: Self-pay | Admitting: Family Medicine

## 2015-03-03 DIAGNOSIS — G43009 Migraine without aura, not intractable, without status migrainosus: Secondary | ICD-10-CM

## 2015-03-03 NOTE — Telephone Encounter (Signed)
Patient is requesting her   oxyCODONE-acetaminophen (PERCOCET/ROXICET) 5-325 MG per tablet

## 2015-03-04 MED ORDER — OXYCODONE-ACETAMINOPHEN 5-325 MG PO TABS
1.0000 | ORAL_TABLET | ORAL | Status: DC | PRN
Start: 2015-03-04 — End: 2015-04-01

## 2015-03-04 NOTE — Telephone Encounter (Signed)
Printed.  Please notify patient. Thanks.  

## 2015-03-29 ENCOUNTER — Other Ambulatory Visit: Payer: Self-pay

## 2015-03-29 DIAGNOSIS — G43009 Migraine without aura, not intractable, without status migrainosus: Secondary | ICD-10-CM

## 2015-03-29 DIAGNOSIS — G43909 Migraine, unspecified, not intractable, without status migrainosus: Secondary | ICD-10-CM

## 2015-03-29 MED ORDER — BUTORPHANOL TARTRATE 10 MG/ML NA SOLN: NASAL | Status: DC

## 2015-03-29 NOTE — Telephone Encounter (Signed)
Prescription printed. Please notify patient it is ready for pick up. Thanks- Dr. Delle Andrzejewski.  

## 2015-03-30 NOTE — Telephone Encounter (Signed)
Pt advised. sd

## 2015-04-01 ENCOUNTER — Other Ambulatory Visit: Payer: Self-pay | Admitting: Family Medicine

## 2015-04-01 DIAGNOSIS — G43009 Migraine without aura, not intractable, without status migrainosus: Secondary | ICD-10-CM

## 2015-04-01 MED ORDER — OXYCODONE-ACETAMINOPHEN 5-325 MG PO TABS: 1.0000 | ORAL_TABLET | ORAL | Status: DC | PRN

## 2015-04-01 NOTE — Telephone Encounter (Signed)
Pt contacted office for refill request on the following medications: oxyCODONE-acetaminophen (PERCOCET/ROXICET) 5-325 MG per tablet. Thanks TNP

## 2015-04-01 NOTE — Telephone Encounter (Signed)
Printed.  Please notify patient. Thanks.  

## 2015-04-04 ENCOUNTER — Other Ambulatory Visit: Payer: Self-pay

## 2015-04-04 DIAGNOSIS — G4700 Insomnia, unspecified: Secondary | ICD-10-CM

## 2015-04-04 DIAGNOSIS — F410 Panic disorder [episodic paroxysmal anxiety] without agoraphobia: Secondary | ICD-10-CM

## 2015-04-04 MED ORDER — ALPRAZOLAM 0.5 MG PO TABS: 0.5000 mg | ORAL_TABLET | Freq: Two times a day (BID) | ORAL | Status: DC | PRN

## 2015-04-04 MED ORDER — ZOLPIDEM TARTRATE 10 MG PO TABS: 10.0000 mg | ORAL_TABLET | Freq: Every day | ORAL | Status: DC

## 2015-04-04 NOTE — Telephone Encounter (Signed)
Prescription called into pharmacy. sd

## 2015-04-04 NOTE — Telephone Encounter (Signed)
OK to call in rx. Thanks.  

## 2015-04-29 ENCOUNTER — Other Ambulatory Visit: Payer: Self-pay | Admitting: Family Medicine

## 2015-04-29 DIAGNOSIS — G43009 Migraine without aura, not intractable, without status migrainosus: Secondary | ICD-10-CM

## 2015-04-29 MED ORDER — OXYCODONE-ACETAMINOPHEN 5-325 MG PO TABS: 1.0000 | ORAL_TABLET | ORAL | Status: DC | PRN

## 2015-04-29 NOTE — Telephone Encounter (Signed)
Pt contacted office for refill request on the following medications: oxyCODONE-acetaminophen (PERCOCET/ROXICET) 5-325 MG per tablet. Thanks TNP

## 2015-05-27 ENCOUNTER — Other Ambulatory Visit: Payer: Self-pay | Admitting: Family Medicine

## 2015-05-27 DIAGNOSIS — G43009 Migraine without aura, not intractable, without status migrainosus: Secondary | ICD-10-CM

## 2015-05-27 MED ORDER — OXYCODONE-ACETAMINOPHEN 5-325 MG PO TABS: 1.0000 | ORAL_TABLET | ORAL | Status: DC | PRN

## 2015-05-27 NOTE — Telephone Encounter (Signed)
Pt contacted office for refill request on the following medications: oxyCODONE-acetaminophen (PERCOCET/ROXICET) 5-325 MG tablet. Pt would like to pick this up today or Monday 05/30/15. Thanks TNP

## 2015-05-27 NOTE — Telephone Encounter (Signed)
Prescription printed. Please notify patient it is ready for pick up. Thanks- Dr. Perry Brucato.  

## 2015-06-07 ENCOUNTER — Ambulatory Visit (INDEPENDENT_AMBULATORY_CARE_PROVIDER_SITE_OTHER): Payer: 59 | Admitting: Family Medicine

## 2015-06-07 ENCOUNTER — Encounter: Payer: Self-pay | Admitting: Family Medicine

## 2015-06-07 VITALS — BP 116/68 | HR 68 | Temp 98.0°F | Resp 16 | Ht 64.0 in | Wt 165.0 lb

## 2015-06-07 DIAGNOSIS — J189 Pneumonia, unspecified organism: Secondary | ICD-10-CM | POA: Diagnosis not present

## 2015-06-07 MED ORDER — PREDNISONE 10 MG PO TABS: ORAL_TABLET | ORAL | Status: DC

## 2015-06-07 MED ORDER — LEVOFLOXACIN 500 MG PO TABS: 500.0000 mg | ORAL_TABLET | Freq: Every day | ORAL | Status: DC

## 2015-06-07 NOTE — Progress Notes (Signed)
Patient ID: Colleen Sloan, female   DOB: 1971/03/21, 44 y.o.   MRN: [id number]        Patient: Colleen Sloan Female    DOB: 1970/08/20   44 y.o.   MRN: [id number] Visit Date: 06/07/2015  Today's Provider: Margarita Rana, MD   Chief Complaint  Patient presents with  . Cough   Subjective:    Cough This is a new problem. The current episode started in the past 7 days. The problem has been gradually worsening. The problem occurs constantly (worse at night). The cough is productive of sputum. Associated symptoms include chills, ear congestion, a fever, headaches, nasal congestion, postnasal drip, rhinorrhea, a sore throat, shortness of breath and wheezing. The symptoms are aggravated by lying down (hot air). She has tried OTC cough suppressant for the symptoms. The treatment provided no relief. Her past medical history is significant for asthma, environmental allergies and pneumonia (This feels similar.  ).  Patient reports fever yesterday evening 101.3, pt reports she took motrin for fever. This is similar to pneumonia she had in the past.      Allergies  Allergen Reactions  . Amoxicillin-Pot Clavulanate Anaphylaxis   Previous Medications   ALBUTEROL (PROAIR HFA) 108 (90 BASE) MCG/ACT INHALER    Inhale 1-2 puffs into the lungs every 6 (six) hours as needed for wheezing or shortness of breath.   ALPRAZOLAM (XANAX) 0.5 MG TABLET    Take 1 tablet (0.5 mg total) by mouth 2 (two) times daily as needed for anxiety.   BUTORPHANOL (STADOL) 10 MG/ML NASAL SPRAY    instill 1 spray in 1 nostril every 4 to 6 hours if needed   CETIRIZINE HCL (ZYRTEC ALLERGY) 10 MG CAPS    Take by mouth.   ESCITALOPRAM (LEXAPRO) 20 MG TABLET    Take 20 mg by mouth daily.    FLUTICASONE (FLONASE) 50 MCG/ACT NASAL SPRAY    Place 2 sprays into both nostrils daily.   OXYCODONE-ACETAMINOPHEN (PERCOCET/ROXICET) 5-325 MG TABLET    Take 1 tablet by mouth every 4 (four) hours as needed for severe pain  (migraine).   ZOLPIDEM (AMBIEN) 10 MG TABLET    Take 1 tablet (10 mg total) by mouth at bedtime.    Review of Systems  Constitutional: Positive for fever and chills.  HENT: Positive for postnasal drip, rhinorrhea and sore throat.   Respiratory: Positive for cough, shortness of breath and wheezing.   Allergic/Immunologic: Positive for environmental allergies.  Neurological: Positive for headaches.    Social History  Substance Use Topics  . Smoking status: Never Smoker   . Smokeless tobacco: Never Used  . Alcohol Use: No   Objective:   BP 116/68 mmHg  Pulse 68  Temp(Src) 98 F (36.7 C) (Oral)  Resp 16  Ht 5\' 4"  (1.626 m)  Wt 165 lb (74.844 kg)  BMI 28.31 kg/m2  SpO2 98%  Physical Exam  Constitutional: She is oriented to person, place, and time. She appears well-developed and well-nourished.  HENT:  Head: Normocephalic and atraumatic.  Right Ear: External ear normal.  Left Ear: External ear normal.  Nose: Mucosal edema and rhinorrhea present. Right sinus exhibits maxillary sinus tenderness. Left sinus exhibits maxillary sinus tenderness.  Mouth/Throat: Oropharynx is clear and moist.  Eyes: Conjunctivae and EOM are normal. Pupils are equal, round, and reactive to light.  Neck: Normal range of motion. Neck supple.  Cardiovascular: Normal rate and regular rhythm.   Pulmonary/Chest: Effort normal. She has wheezes.  Neurological: She is  alert and oriented to person, place, and time.  Psychiatric: She has a normal mood and affect. Her behavior is normal. Judgment and thought content normal.      Assessment & Plan:     1. Pneumonia, unspecified laterality, unspecified part of lung New problem. Suspect pneumonia, or may be severe asthmatic bronchitis.  Will treat, check CXR and call if worsens or does not improve.   - DG Chest 2 View; Future - predniSONE (DELTASONE) 10 MG tablet; 6 po for 2 days and then 5 po for 2 days and then 4 po for 2 days and 3 po for 2 days and then 2 po  for 2 days and then 1 po for 2 days.  Dispense: 42 tablet; Refill: 0 - levofloxacin (LEVAQUIN) 500 MG tablet; Take 1 tablet (500 mg total) by mouth daily.  Dispense: 7 tablet; Refill: 0     Margarita Rana, MD  [facility]

## 2015-06-09 ENCOUNTER — Other Ambulatory Visit: Payer: Self-pay

## 2015-06-09 DIAGNOSIS — G4700 Insomnia, unspecified: Secondary | ICD-10-CM

## 2015-06-09 DIAGNOSIS — F410 Panic disorder [episodic paroxysmal anxiety] without agoraphobia: Secondary | ICD-10-CM

## 2015-06-13 ENCOUNTER — Other Ambulatory Visit: Payer: Self-pay | Admitting: Family Medicine

## 2015-06-13 DIAGNOSIS — G4700 Insomnia, unspecified: Secondary | ICD-10-CM

## 2015-06-13 DIAGNOSIS — F410 Panic disorder [episodic paroxysmal anxiety] without agoraphobia: Secondary | ICD-10-CM

## 2015-06-13 MED ORDER — ALPRAZOLAM 0.5 MG PO TABS: 0.5000 mg | ORAL_TABLET | Freq: Two times a day (BID) | ORAL | Status: DC | PRN

## 2015-06-13 MED ORDER — ZOLPIDEM TARTRATE 10 MG PO TABS: 10.0000 mg | ORAL_TABLET | Freq: Every day | ORAL | Status: DC

## 2015-06-13 NOTE — Telephone Encounter (Signed)
Pt contacted office for refill request on the following medications: 1. ALPRAZolam (XANAX) 0.5 MG tablet  2. zolpidem (AMBIEN) 10 MG tablet  To mail service [facility]. Thanks TNP

## 2015-06-13 NOTE — Telephone Encounter (Signed)
Medication faxed to [facility].

## 2015-06-13 NOTE — Telephone Encounter (Signed)
Printed, please fax or call in to pharmacy. Thank you.   

## 2015-06-22 ENCOUNTER — Encounter: Payer: Self-pay | Admitting: *Deleted

## 2015-06-22 ENCOUNTER — Ambulatory Visit
Admission: EM | Admit: 2015-06-22 | Discharge: 2015-06-22 | Disposition: A | Discharge status: Home / Self Care | Payer: 59 | Attending: Family Medicine | Admitting: Family Medicine

## 2015-06-22 DIAGNOSIS — J069 Acute upper respiratory infection, unspecified: Secondary | ICD-10-CM

## 2015-06-22 DIAGNOSIS — J4521 Mild intermittent asthma with (acute) exacerbation: Secondary | ICD-10-CM | POA: Diagnosis not present

## 2015-06-22 DIAGNOSIS — B9789 Other viral agents as the cause of diseases classified elsewhere: Principal | ICD-10-CM

## 2015-06-22 MED ORDER — GUAIFENESIN-CODEINE 100-10 MG/5ML PO SOLN: 10.0000 mL | Freq: Four times a day (QID) | ORAL | Status: DC | PRN

## 2015-06-22 MED ORDER — PREDNISONE 20 MG PO TABS: ORAL_TABLET | ORAL | Status: DC

## 2015-06-22 NOTE — ED Provider Notes (Signed)
CSN: [id number]     Arrival date & time 06/22/15  1857 History   First MD Initiated Contact with Patient 06/22/15 2003     Chief Complaint  Patient presents with  . Cough  . Nasal Congestion   (Consider location/radiation/quality/duration/timing/severity/associated sxs/prior Treatment) HPI Comments: 44 yo female with a 5 days h/o cough and nasal congestion. States one month ago had similar symptoms and was seen about 2 weeks ago and prescribed Levaquin x 7 days and prednisone 7 days course which she finished 5 days ago. States symptoms resolved, then started developing cough, nasal congestion and increased wheezing. Using albuterol MDI with relief.   Patient is a 44 y.o. female presenting with cough and URI. The history is provided by the patient.  Cough Associated symptoms: sore throat   Associated symptoms: no wheezing   URI Presenting symptoms: congestion, cough and sore throat   Severity:  Moderate Onset quality:  Sudden Duration:  5 days Timing:  Constant Progression:  Worsening Chronicity:  New Associated symptoms: no wheezing     Past Medical History  Diagnosis Date  . Migraines     TAKES TYLOX PRN  . Vaginal delivery 2010    [facility]  . Asthma    Past Surgical History  Procedure Laterality Date  . Hand surgery  1993  . Dilation and curettage of uterus  1996  . Knee surgery  1999, 2001    X 2 Removal of Cysts  . Intrauterine device insertion  04/02/2014    [facility]  . Ankle surgery Right 2003    Reconstruct Ligaments  . Thumb surg     Family History  Problem Relation Age of Onset  . Hypertension Father   . Hemachromatosis Father   . Depression Mother   . Anxiety disorder Mother   . Bipolar disorder Sister   . Mental illness Sister   . Bipolar disorder Maternal Aunt   . Cancer Maternal Aunt     BRAIN  . Other Maternal Grandmother     ANXIETY  . Asthma Maternal Grandmother   . Cancer Paternal Grandmother     LIVER   Social History   Substance Use Topics  . Smoking status: Never Smoker   . Smokeless tobacco: Never Used  . Alcohol Use: No   OB History    Gravida Para Term Preterm AB TAB SAB Ectopic Multiple Living   5 3   2     3      Review of Systems  HENT: Positive for congestion and sore throat.   Respiratory: Positive for cough. Negative for wheezing.     Allergies  Amoxicillin-pot clavulanate  Home Medications   Prior to Admission medications   Medication Sig Start Date End Date Taking? Authorizing Provider  chlorpheniramine (CHLOR-TRIMETON) 4 MG tablet Take 4 mg by mouth every 4 (four) hours as needed for allergies.   Yes Historical Provider, MD  pseudoephedrine (SUDAFED) 120 MG 12 hr tablet Take 120 mg by mouth daily.   Yes Historical Provider, MD  albuterol (PROAIR HFA) 108 (90 BASE) MCG/ACT inhaler Inhale 1-2 puffs into the lungs every 6 (six) hours as needed for wheezing or shortness of breath.    Historical Provider, MD  ALPRAZolam Duanne Moron) 0.5 MG tablet Take 1 tablet (0.5 mg total) by mouth 2 (two) times daily as needed for anxiety. 06/13/15   Margarita Rana, MD  butorphanol (STADOL) 10 MG/ML nasal spray instill 1 spray in 1 nostril every 4 to 6 hours if needed 03/29/15  Margarita Rana, MD  Cetirizine HCl (ZYRTEC ALLERGY) 10 MG CAPS Take by mouth.    Historical Provider, MD  escitalopram (LEXAPRO) 20 MG tablet Take 20 mg by mouth daily.  12/08/14   Historical Provider, MD  fluticasone (FLONASE) 50 MCG/ACT nasal spray Place 2 sprays into both nostrils daily.    Historical Provider, MD  guaiFENesin-codeine 100-10 MG/5ML syrup Take 10 mLs by mouth every 6 (six) hours as needed for cough. 06/22/15   Norval Gable, MD  levofloxacin (LEVAQUIN) 500 MG tablet Take 1 tablet (500 mg total) by mouth daily. 06/07/15   Margarita Rana, MD  oxyCODONE-acetaminophen (PERCOCET/ROXICET) 5-325 MG tablet Take 1 tablet by mouth every 4 (four) hours as needed for severe pain (migraine). 05/27/15   Margarita Rana, MD  predniSONE  (DELTASONE) 20 MG tablet 3 tabs po qd for 2 days, then 2 tabs po qd for 3 days, then 1 tab po qd for 3 days, then half a tab po qd for 2 days 06/22/15   Norval Gable, MD  zolpidem (AMBIEN) 10 MG tablet Take 1 tablet (10 mg total) by mouth at bedtime. 06/13/15   Margarita Rana, MD   Meds Ordered and Administered this Visit  Medications - No data to display  BP 96/60 mmHg  Pulse 79  Temp(Src) 97.2 F (36.2 C) (Tympanic)  Ht 5\' 4"  (1.626 m)  Wt 158 lb (71.668 kg)  BMI 27.11 kg/m2  SpO2 100% No data found.   Physical Exam  Constitutional: She appears well-developed and well-nourished. No distress.  HENT:  Head: Normocephalic.  Right Ear: Tympanic membrane, external ear and ear canal normal.  Left Ear: Tympanic membrane, external ear and ear canal normal.  Nose: Nose normal.  Mouth/Throat: Oropharynx is clear and moist and mucous membranes are normal. No oropharyngeal exudate.  Eyes: Conjunctivae and EOM are normal. Pupils are equal, round, and reactive to light. Right eye exhibits no discharge. Left eye exhibits no discharge. No scleral icterus.  Neck: Normal range of motion. Neck supple. No JVD present. No tracheal deviation present. No thyromegaly present.  Cardiovascular: Normal rate, regular rhythm, normal heart sounds and intact distal pulses.   No murmur heard. Pulmonary/Chest: Effort normal and breath sounds normal. No stridor. No respiratory distress. She has no wheezes. She has no rales. She exhibits no tenderness.  Lymphadenopathy:    She has no cervical adenopathy.  Neurological: She is alert.  Skin: No rash noted. She is not diaphoretic.  Vitals reviewed.   ED Course  Procedures (including critical care time)  Labs Review Labs Reviewed - No data to display  Imaging Review No results found.   Visual Acuity Review  Right Eye Distance:   Left Eye Distance:   Bilateral Distance:    Right Eye Near:   Left Eye Near:    Bilateral Near:         MDM   1.  Viral URI with cough   2. Asthma, mild intermittent, with acute exacerbation   (mild exacerbation secondary to viral URI)  Discharge Medication List as of 06/22/2015  8:28 PM    START taking these medications   Details  guaiFENesin-codeine 100-10 MG/5ML syrup Take 10 mLs by mouth every 6 (six) hours as needed for cough., Starting 06/22/2015, Until Discontinued, Print       1. diagnosis reviewed with patient 2. rx as per orders above; reviewed possible side effects, interactions, risks and benefits  3. Recommend supportive treatment with increased fluids, rest 4. Follow-up prn if symptoms worsen  or don't improve    Norval Gable, MD 06/22/15 2119

## 2015-06-22 NOTE — ED Notes (Signed)
Pt states that she has a cough, congestion and is short of breath for about 1 month, pt was on Levaquin (completed on 06/17/15) and prednisone (completed on 06/19/15) that was given to her by her PCP.  While on the medications pt states that she started feeling better, wasn't ever completely gone.

## 2015-06-24 ENCOUNTER — Other Ambulatory Visit: Payer: Self-pay | Admitting: Family Medicine

## 2015-06-24 DIAGNOSIS — G43009 Migraine without aura, not intractable, without status migrainosus: Secondary | ICD-10-CM

## 2015-06-24 MED ORDER — OXYCODONE-ACETAMINOPHEN 5-325 MG PO TABS: 1.0000 | ORAL_TABLET | ORAL | Status: DC | PRN

## 2015-06-24 NOTE — Telephone Encounter (Signed)
Pt called for refill oxyCODONE-acetaminophen (PERCOCET/ROXICET) 5-325 MG tablet  She has some left but would like to get a refill today if possible.  Call back is [phone]  Thanks teri

## 2015-07-12 ENCOUNTER — Other Ambulatory Visit: Payer: Self-pay | Admitting: Family Medicine

## 2015-07-12 DIAGNOSIS — G43909 Migraine, unspecified, not intractable, without status migrainosus: Secondary | ICD-10-CM

## 2015-07-22 ENCOUNTER — Other Ambulatory Visit: Payer: Self-pay | Admitting: Family Medicine

## 2015-07-22 DIAGNOSIS — G43009 Migraine without aura, not intractable, without status migrainosus: Secondary | ICD-10-CM

## 2015-07-22 MED ORDER — OXYCODONE-ACETAMINOPHEN 5-325 MG PO TABS: 1.0000 | ORAL_TABLET | ORAL | Status: DC | PRN

## 2015-07-22 NOTE — Telephone Encounter (Signed)
Last OV: 06/07/2015  Last Refill: 06/24/2015

## 2015-07-22 NOTE — Telephone Encounter (Signed)
Pt contacted office for refill request on the following medications:  oxyCODONE-acetaminophen (PERCOCET/ROXICET) 5-325 MG tablet.  CB#[phone]/MW

## 2015-07-22 NOTE — Telephone Encounter (Signed)
Patient advised RX is ready for pick up.  

## 2015-07-22 NOTE — Telephone Encounter (Signed)
Prescription printed. Please notify patient it is ready for pick up. Thanks- Dr. Justene Jensen.  

## 2015-08-08 ENCOUNTER — Ambulatory Visit (INDEPENDENT_AMBULATORY_CARE_PROVIDER_SITE_OTHER): Payer: 59 | Admitting: Gynecology

## 2015-08-08 ENCOUNTER — Encounter: Payer: Self-pay | Admitting: Gynecology

## 2015-08-08 VITALS — BP 114/70 | Ht 65.0 in | Wt 169.0 lb

## 2015-08-08 DIAGNOSIS — Z13220 Encounter for screening for lipoid disorders: Secondary | ICD-10-CM

## 2015-08-08 DIAGNOSIS — Z30431 Encounter for routine checking of intrauterine contraceptive device: Secondary | ICD-10-CM

## 2015-08-08 DIAGNOSIS — Z01419 Encounter for gynecological examination (general) (routine) without abnormal findings: Secondary | ICD-10-CM | POA: Diagnosis not present

## 2015-08-08 NOTE — Patient Instructions (Signed)
Call to Schedule your mammogram  Facilities in [place]: 1)  [facility]. [address] [place] Phone: [phone])  Dr. Isaiah Blakes at [facility] [address] Phone: [phone]     Mammogram A mammogram is an X-ray test to find changes in a woman's breast. You should get a mammogram if:  You are 43 years of age or older  You have risk factors.   Your doctor recommends that you have one.  BEFORE THE TEST  Do not schedule the test the week before your period, especially if your breasts are sore during this time.  On the day of your mammogram:  Wash your breasts and armpits well. After washing, do not put on any deodorant or talcum powder on until after your test.   Eat and drink as you usually do.   Take your medicines as usual.   If you are diabetic and take insulin, make sure you:   Eat before coming for your test.   Take your insulin as usual.   If you cannot keep your appointment, call before the appointment to cancel. Schedule another appointment.  TEST  You will need to undress from the waist up. You will put on a hospital gown.   Your breast will be put on the mammogram machine, and it will press firmly on your breast with a piece of plastic called a compression paddle. This will make your breast flatter so that the machine can X-ray all parts of your breast.   Both breasts will be X-rayed. Each breast will be X-rayed from above and from the side. An X-ray might need to be taken again if the picture is not good enough.   The mammogram will last about 15 to 30 minutes.  AFTER THE TEST Finding out the results of your test Ask when your test results will be ready. Make sure you get your test results.  Document Released: 10/05/2008 Document Revised: 06/28/2011 Document Reviewed: 10/05/2008 [facility] Patient Information 2012 [place].  You may obtain a copy of any labs that were  done today by logging onto MyChart as outlined in the instructions provided with your AVS (after visit summary). The office will not call with normal lab results but certainly if there are any significant abnormalities then we will contact you.   Health Maintenance Adopting a healthy lifestyle and getting preventive care can go a long way to promote health and wellness. Talk with your health care provider about what schedule of regular examinations is right for you. This is a good chance for you to check in with your provider about disease prevention and staying healthy. In between checkups, there are plenty of things you can do on your own. Experts have done a lot of research about which lifestyle changes and preventive measures are most likely to keep you healthy. Ask your health care provider for more information. WEIGHT AND DIET  Eat a healthy diet  Be sure to include plenty of vegetables, fruits, low-fat dairy products, and lean protein.  Do not eat a lot of foods high in solid fats, added sugars, or salt.  Get regular exercise. This is one of the most important things you can do for your health.  Most adults should exercise for at least 150 minutes each week. The exercise should increase your heart rate and make you sweat (moderate-intensity exercise).  Most adults should also do strengthening exercises at least twice a  week. This is in addition to the moderate-intensity exercise.  Maintain a healthy weight  Body mass index (BMI) is a measurement that can be used to identify possible weight problems. It estimates body fat based on height and weight. Your health care provider can help determine your BMI and help you achieve or maintain a healthy weight.  For females 20 years of age and older:   A BMI below 18.5 is considered underweight.  A BMI of 18.5 to 24.9 is normal.  A BMI of 25 to 29.9 is considered overweight.  A BMI of 30 and above is considered obese.  Watch levels of  cholesterol and blood lipids  You should start having your blood tested for lipids and cholesterol at 43 years of age, then have this test every 5 years.  You may need to have your cholesterol levels checked more often if:  Your lipid or cholesterol levels are high.  You are older than 43 years of age.  You are at high risk for heart disease.  CANCER SCREENING   Lung Cancer  Lung cancer screening is recommended for adults 55-80 years old who are at high risk for lung cancer because of a history of smoking.  A yearly low-dose CT scan of the lungs is recommended for people who:  Currently smoke.  Have quit within the past 15 years.  Have at least a 30-pack-year history of smoking. A pack year is smoking an average of one pack of cigarettes a day for 1 year.  Yearly screening should continue until it has been 15 years since you quit.  Yearly screening should stop if you develop a health problem that would prevent you from having lung cancer treatment.  Breast Cancer  Practice breast self-awareness. This means understanding how your breasts normally appear and feel.  It also means doing regular breast self-exams. Let your health care provider know about any changes, no matter how small.  If you are in your 20s or 30s, you should have a clinical breast exam (CBE) by a health care provider every 1-3 years as part of a regular health exam.  If you are 40 or older, have a CBE every year. Also consider having a breast X-ray (mammogram) every year.  If you have a family history of breast cancer, talk to your health care provider about genetic screening.  If you are at high risk for breast cancer, talk to your health care provider about having an MRI and a mammogram every year.  Breast cancer gene (BRCA) assessment is recommended for women who have family members with BRCA-related cancers. BRCA-related cancers include:  Breast.  Ovarian.  Tubal.  Peritoneal  cancers.  Results of the assessment will determine the need for genetic counseling and BRCA1 and BRCA2 testing. Cervical Cancer Routine pelvic examinations to screen for cervical cancer are no longer recommended for nonpregnant women who are considered low risk for cancer of the pelvic organs (ovaries, uterus, and vagina) and who do not have symptoms. A pelvic examination may be necessary if you have symptoms including those associated with pelvic infections. Ask your health care provider if a screening pelvic exam is right for you.   The Pap test is the screening test for cervical cancer for women who are considered at risk.  If you had a hysterectomy for a problem that was not cancer or a condition that could lead to cancer, then you no longer need Pap tests.  If you are older than 65 years, and   you have had normal Pap tests for the past 10 years, you no longer need to have Pap tests.  If you have had past treatment for cervical cancer or a condition that could lead to cancer, you need Pap tests and screening for cancer for at least 20 years after your treatment.  If you no longer get a Pap test, assess your risk factors if they change (such as having a new sexual partner). This can affect whether you should start being screened again.  Some women have medical problems that increase their chance of getting cervical cancer. If this is the case for you, your health care provider may recommend more frequent screening and Pap tests.  The human papillomavirus (HPV) test is another test that may be used for cervical cancer screening. The HPV test looks for the virus that can cause cell changes in the cervix. The cells collected during the Pap test can be tested for HPV.  The HPV test can be used to screen women 63 years of age and older. Getting tested for HPV can extend the interval between normal Pap tests from three to five years.  An HPV test also should be used to screen women of any age who  have unclear Pap test results.  After 43 years of age, women should have HPV testing as often as Pap tests.  Colorectal Cancer  This type of cancer can be detected and often prevented.  Routine colorectal cancer screening usually begins at 43 years of age and continues through 43 years of age.  Your health care provider may recommend screening at an earlier age if you have risk factors for colon cancer.  Your health care provider may also recommend using home test kits to check for hidden blood in the stool.  A small camera at the end of a tube can be used to examine your colon directly (sigmoidoscopy or colonoscopy). This is done to check for the earliest forms of colorectal cancer.  Routine screening usually begins at age 68.  Direct examination of the colon should be repeated every 5-10 years through 43 years of age. However, you may need to be screened more often if early forms of precancerous polyps or small growths are found. Skin Cancer  Check your skin from head to toe regularly.  Tell your health care provider about any new moles or changes in moles, especially if there is a change in a mole's shape or color.  Also tell your health care provider if you have a mole that is larger than the size of a pencil eraser.  Always use sunscreen. Apply sunscreen liberally and repeatedly throughout the day.  Protect yourself by wearing long sleeves, pants, a wide-brimmed hat, and sunglasses whenever you are outside. HEART DISEASE, DIABETES, AND HIGH BLOOD PRESSURE   Have your blood pressure checked at least every 1-2 years. High blood pressure causes heart disease and increases the risk of stroke.  If you are between 42 years and 28 years old, ask your health care provider if you should take aspirin to prevent strokes.  Have regular diabetes screenings. This involves taking a blood sample to check your fasting blood sugar level.  If you are at a normal weight and have a low risk for  diabetes, have this test once every three years after 43 years of age.  If you are overweight and have a high risk for diabetes, consider being tested at a younger age or more often. PREVENTING INFECTION  Hepatitis B  If you have a higher risk for hepatitis B, you should be screened for this virus. You are considered at high risk for hepatitis B if:  You were born in a country where hepatitis B is common. Ask your health care provider which countries are considered high risk.  Your parents were born in a high-risk country, and you have not been immunized against hepatitis B (hepatitis B vaccine).  You have HIV or AIDS.  You use needles to inject street drugs.  You live with someone who has hepatitis B.  You have had sex with someone who has hepatitis B.  You get hemodialysis treatment.  You take certain medicines for conditions, including cancer, organ transplantation, and autoimmune conditions. Hepatitis C  Blood testing is recommended for:  Everyone born from 1945 through 1965.  Anyone with known risk factors for hepatitis C. Sexually transmitted infections (STIs)  You should be screened for sexually transmitted infections (STIs) including gonorrhea and chlamydia if:  You are sexually active and are younger than 43 years of age.  You are older than 43 years of age and your health care provider tells you that you are at risk for this type of infection.  Your sexual activity has changed since you were last screened and you are at an increased risk for chlamydia or gonorrhea. Ask your health care provider if you are at risk.  If you do not have HIV, but are at risk, it may be recommended that you take a prescription medicine daily to prevent HIV infection. This is called pre-exposure prophylaxis (PrEP). You are considered at risk if:  You are sexually active and do not regularly use condoms or know the HIV status of your partner(s).  You take drugs by injection.  You are  sexually active with a partner who has HIV. Talk with your health care provider about whether you are at high risk of being infected with HIV. If you choose to begin PrEP, you should first be tested for HIV. You should then be tested every 3 months for as long as you are taking PrEP.  PREGNANCY   If you are premenopausal and you may become pregnant, ask your health care provider about preconception counseling.  If you may become pregnant, take 400 to 800 micrograms (mcg) of folic acid every day.  If you want to prevent pregnancy, talk to your health care provider about birth control (contraception). OSTEOPOROSIS AND MENOPAUSE   Osteoporosis is a disease in which the bones lose minerals and strength with aging. This can result in serious bone fractures. Your risk for osteoporosis can be identified using a bone density scan.  If you are 65 years of age or older, or if you are at risk for osteoporosis and fractures, ask your health care provider if you should be screened.  Ask your health care provider whether you should take a calcium or vitamin D supplement to lower your risk for osteoporosis.  Menopause may have certain physical symptoms and risks.  Hormone replacement therapy may reduce some of these symptoms and risks. Talk to your health care provider about whether hormone replacement therapy is right for you.  HOME CARE INSTRUCTIONS   Schedule regular health, dental, and eye exams.  Stay current with your immunizations.   Do not use any tobacco products including cigarettes, chewing tobacco, or electronic cigarettes.  If you are pregnant, do not drink alcohol.  If you are breastfeeding, limit how much and how often you drink alcohol.  Limit alcohol   intake to no more than 1 drink per day for nonpregnant women. One drink equals 12 ounces of beer, 5 ounces of wine, or 1 ounces of hard liquor.  Do not use street drugs.  Do not share needles.  Ask your health care provider for  help if you need support or information about quitting drugs.  Tell your health care provider if you often feel depressed.  Tell your health care provider if you have ever been abused or do not feel safe at home. Document Released: 01/22/2011 Document Revised: 11/23/2013 Document Reviewed: 06/10/2013 [facility] Patient Information 2015 [place], [place]. This information is not intended to replace advice given to you by your health care provider. Make sure you discuss any questions you have with your health care provider.

## 2015-08-08 NOTE — Addendum Note (Signed)
Addended by: Nelva Nay on: 08/08/2015 02:38 PM   Modules accepted: Orders

## 2015-08-08 NOTE — Progress Notes (Signed)
Colleen Sloan 1971/06/08 [id number]        44 y.o.  [id number]  for annual exam.  Doing well.  Past medical history,surgical history, problem list, medications, allergies, family history and social history were all reviewed and documented as reviewed in the EPIC chart.  ROS:  Performed with pertinent positives and negatives included in the history, assessment and plan.   Additional significant findings :  none   Exam: Colleen Sloan assistant Filed Vitals:   08/08/15 1410  BP: 114/70  Height: 5\' 5"  (1.651 m)  Weight: 169 lb (76.658 kg)   General appearance:  Normal affect, orientation and appearance. Skin: Grossly normal HEENT: Without gross lesions.  No cervical or supraclavicular adenopathy. Thyroid normal.  Lungs:  Clear without wheezing, rales or rhonchi Cardiac: RR, without RMG Abdominal:  Soft, nontender, without masses, guarding, rebound, organomegaly or hernia Breasts:  Examined lying and sitting without masses, retractions, discharge or axillary adenopathy. Pelvic:  Ext/BUS/vagina normal  Cervix normal. IUD string visualized  Uterus anteverted, normal size, shape and contour, midline and mobile nontender   Adnexa  Without masses or tenderness    Anus and perineum  Normal   Rectovaginal  Normal sphincter tone without palpated masses or tenderness.    Assessment/Plan:  45 y.o. [id number] female for annual exam without menses, Mirena IUD.   1. Mirena IUD 03/2014 doing well without menses. IUD string visualized. 2. Pap smear/HPV negative 07/2014. No Pap smear done today. No history of abnormal Pap smears previously. 3. Mammogram never. I again strongly recommended patient schedule a screening mammogram. Benefits of early detection reviewed. Patient promises she will schedule. Names and numbers provided. SBE monthly reviewed. 4. Health maintenance. Baseline CBC, comprehensive metabolic panel, lipid profile, urinalysis ordered. Follow up in one year, sooner as  needed.   Colleen Auerbach MD, 2:26 PM 08/08/2015

## 2015-08-19 ENCOUNTER — Telehealth: Payer: Self-pay | Admitting: Family Medicine

## 2015-08-19 DIAGNOSIS — G43009 Migraine without aura, not intractable, without status migrainosus: Secondary | ICD-10-CM

## 2015-08-19 MED ORDER — OXYCODONE-ACETAMINOPHEN 5-325 MG PO TABS: 1.0000 | ORAL_TABLET | ORAL | Status: DC | PRN

## 2015-08-19 NOTE — Telephone Encounter (Signed)
Pt contacted office for refill request on the following medications: oxyCODONE-acetaminophen (PERCOCET/ROXICET) 5-325 MG tablet   This is a pt of Dr. Sharyon Medicus and pt is requesting to pick up RX today if possible b/c she thinks she might run out over the weekend. Last written on 07/22/15. Please advise. Thanks TNP

## 2015-08-24 ENCOUNTER — Telehealth: Payer: Self-pay | Admitting: *Deleted

## 2015-08-24 NOTE — Telephone Encounter (Signed)
Pt informed with the below note. 

## 2015-08-24 NOTE — Telephone Encounter (Signed)
Pt had labs done at [facility], have been scanned in. Pt said her father has hemochromatosis she forgot to mention this to you at office visit. Asked if you could review labs to confirm all her level are normal and if any additional labs should be order due to family history.  Please advise

## 2015-08-24 NOTE — Telephone Encounter (Signed)
There are several ways to check for hemachromatosis to include doing iron studies up to doing genetic testing for mutated gene. I would recommend that she have her father ask his Dr. who is following him for the hemochromatosis about family testing and what they recommend. If the father has a specific genetic mutation the patient can be checked directly for that.

## 2015-09-16 ENCOUNTER — Other Ambulatory Visit: Payer: Self-pay | Admitting: Family Medicine

## 2015-09-16 DIAGNOSIS — G43009 Migraine without aura, not intractable, without status migrainosus: Secondary | ICD-10-CM

## 2015-09-16 NOTE — Telephone Encounter (Signed)
Pt needs refill on her oxyCODONE-acetaminophen (PERCOCET/ROXICET) 5-325 MG tablet  Call when ready to pick up.  [phone]  Thanks, Con Memos

## 2015-09-19 MED ORDER — OXYCODONE-ACETAMINOPHEN 5-325 MG PO TABS: 1.0000 | ORAL_TABLET | ORAL | Status: DC | PRN

## 2015-09-19 NOTE — Telephone Encounter (Signed)
Prescription printed. Please notify patient it is ready for pick up. Thanks- Dr. Carrina Schoenberger.  

## 2015-10-17 ENCOUNTER — Other Ambulatory Visit: Payer: Self-pay | Admitting: Family Medicine

## 2015-10-17 DIAGNOSIS — G43009 Migraine without aura, not intractable, without status migrainosus: Secondary | ICD-10-CM

## 2015-10-17 MED ORDER — OXYCODONE-ACETAMINOPHEN 5-325 MG PO TABS: 1.0000 | ORAL_TABLET | ORAL | Status: DC | PRN

## 2015-10-17 NOTE — Telephone Encounter (Signed)
Pt contacted office for refill request on the following medications: oxyCODONE-acetaminophen (PERCOCET/ROXICET) 5-325 MG tablet for migraines. Pt is aware that Dr. Venia Minks is out of the office this week. Last written on 09/19/15 & last OV 06/07/15. Please advise. Thanks TNP

## 2015-10-27 ENCOUNTER — Other Ambulatory Visit: Payer: Self-pay

## 2015-10-27 DIAGNOSIS — F410 Panic disorder [episodic paroxysmal anxiety] without agoraphobia: Secondary | ICD-10-CM

## 2015-10-27 MED ORDER — ALPRAZOLAM 0.5 MG PO TABS: 0.5000 mg | ORAL_TABLET | Freq: Two times a day (BID) | ORAL | Status: DC | PRN

## 2015-10-27 NOTE — Telephone Encounter (Signed)
Printed, please fax or call in to pharmacy. Thank you.   

## 2015-10-28 ENCOUNTER — Other Ambulatory Visit: Payer: Self-pay

## 2015-10-28 DIAGNOSIS — G4700 Insomnia, unspecified: Secondary | ICD-10-CM

## 2015-10-28 MED ORDER — ZOLPIDEM TARTRATE 10 MG PO TABS: 10.0000 mg | ORAL_TABLET | Freq: Every day | ORAL | Status: DC

## 2015-10-28 NOTE — Telephone Encounter (Signed)
Printed, please fax or call in to pharmacy. Thank you.   

## 2015-11-11 ENCOUNTER — Other Ambulatory Visit: Payer: Self-pay

## 2015-11-11 DIAGNOSIS — G43009 Migraine without aura, not intractable, without status migrainosus: Secondary | ICD-10-CM

## 2015-11-11 MED ORDER — OXYCODONE-ACETAMINOPHEN 5-325 MG PO TABS: 1.0000 | ORAL_TABLET | ORAL | Status: DC | PRN

## 2015-11-11 NOTE — Telephone Encounter (Signed)
Prescription printed. Please notify patient it is ready for pick up. Thanks- Dr. Nichalas Coin.  

## 2015-11-11 NOTE — Telephone Encounter (Signed)
Patient called to get a refill and she just needs to have it done by Monday if can not do it today. Please review.-aa

## 2015-11-30 ENCOUNTER — Other Ambulatory Visit: Payer: Self-pay | Admitting: Family Medicine

## 2015-11-30 DIAGNOSIS — G43009 Migraine without aura, not intractable, without status migrainosus: Secondary | ICD-10-CM

## 2015-12-02 ENCOUNTER — Telehealth: Payer: Self-pay | Admitting: Family Medicine

## 2015-12-02 NOTE — Telephone Encounter (Signed)
butorphanol (STADOL) 10 MG/ML nasal spray was printed on 11/30/15. Pt called to see if we could call or send the RX to [facility] in [place]. Pt request a call back if she has to pick up a hard script. I also scheduled pt for a F/U on 12/22/15. Please advise. Thanks TNP

## 2015-12-08 ENCOUNTER — Other Ambulatory Visit: Payer: Self-pay | Admitting: Family Medicine

## 2015-12-08 DIAGNOSIS — G43009 Migraine without aura, not intractable, without status migrainosus: Secondary | ICD-10-CM

## 2015-12-08 MED ORDER — OXYCODONE-ACETAMINOPHEN 5-325 MG PO TABS: 1.0000 | ORAL_TABLET | ORAL | Status: DC | PRN

## 2015-12-08 NOTE — Telephone Encounter (Signed)
Prescription printed. Please notify patient it is ready for pick up. Thanks- Dr. Quanasia Defino.  

## 2015-12-08 NOTE — Telephone Encounter (Signed)
Pt contacted office for refill request on the following medications: oxyCODONE-acetaminophen (PERCOCET/ROXICET) 5-325 MG tablet. Last Written: 11/11/15 Last OV: 06/07/15 Please advise. Thanks TNP

## 2015-12-14 ENCOUNTER — Other Ambulatory Visit: Payer: Self-pay | Admitting: Family Medicine

## 2015-12-14 DIAGNOSIS — J45909 Unspecified asthma, uncomplicated: Secondary | ICD-10-CM

## 2015-12-22 ENCOUNTER — Ambulatory Visit: Payer: 59 | Admitting: Family Medicine

## 2015-12-27 ENCOUNTER — Encounter: Payer: Self-pay | Admitting: Family Medicine

## 2015-12-27 ENCOUNTER — Ambulatory Visit (INDEPENDENT_AMBULATORY_CARE_PROVIDER_SITE_OTHER): Payer: 59 | Admitting: Family Medicine

## 2015-12-27 VITALS — BP 124/70 | HR 72 | Temp 98.7°F | Resp 16 | Wt 168.0 lb

## 2015-12-27 DIAGNOSIS — G43009 Migraine without aura, not intractable, without status migrainosus: Secondary | ICD-10-CM | POA: Diagnosis not present

## 2015-12-27 DIAGNOSIS — G4700 Insomnia, unspecified: Secondary | ICD-10-CM

## 2015-12-27 DIAGNOSIS — F410 Panic disorder [episodic paroxysmal anxiety] without agoraphobia: Secondary | ICD-10-CM | POA: Diagnosis not present

## 2015-12-27 MED ORDER — BUTORPHANOL TARTRATE 10 MG/ML NA SOLN: NASAL | Status: DC

## 2015-12-27 MED ORDER — OXYCODONE-ACETAMINOPHEN 5-325 MG PO TABS: 1.0000 | ORAL_TABLET | ORAL | Status: DC | PRN

## 2015-12-27 MED ORDER — ALPRAZOLAM 0.5 MG PO TABS: 0.5000 mg | ORAL_TABLET | Freq: Two times a day (BID) | ORAL | Status: DC | PRN

## 2015-12-27 MED ORDER — ESCITALOPRAM OXALATE 20 MG PO TABS: 20.0000 mg | ORAL_TABLET | Freq: Every day | ORAL | Status: DC

## 2015-12-27 MED ORDER — ZOLPIDEM TARTRATE 10 MG PO TABS
10.0000 mg | ORAL_TABLET | Freq: Every day | ORAL | Status: DC
Start: 2015-12-27 — End: 2016-03-30

## 2015-12-27 NOTE — Progress Notes (Signed)
Patient ID: Colleen Sloan, female   DOB: [birth year], 45 y.o.   MRN: [id number]       Patient: Colleen Sloan Female    DOB: 1970-11-06   45 y.o.   MRN: [id number] Visit Date: 12/27/2015  Today's Provider: Margarita Rana, MD   Chief Complaint  Patient presents with  . Migraine  . Anxiety   Subjective:    Migraine  This is a chronic problem. The current episode started more than 1 year ago. The problem has been waxing and waning (Pt reports having about 5-7 Migraines a month, and can last up to two days. ). The pain is located in the bilateral and frontal (Starts on the right side then; radiates to her left side.) region. The pain quality is similar to prior headaches. The quality of the pain is described as stabbing and sharp. Pertinent negatives include no dizziness, fever, insomnia or nausea.  Anxiety Presents for follow-up visit. The problem has been unchanged (Pt says her symptoms are under good control with current medications.). Symptoms include nervous/anxious behavior and panic (Rarely but under good control with current medications.). Patient reports no chest pain, compulsions, confusion, decreased concentration, depressed mood, dizziness, dry mouth, excessive worry, feeling of choking, hyperventilation, impotence, insomnia, irritability, malaise, muscle tension, nausea, palpitations, restlessness, shortness of breath or suicidal ideas. The quality of sleep is good (When taking Ambien.).   The treatment provided moderate relief. Compliance with prior treatments has been good.       Allergies  Allergen Reactions  . Augmentin [Amoxicillin-Pot Clavulanate] Anaphylaxis   Previous Medications   ADVAIR DISKUS 250-50 MCG/DOSE AEPB    Inhale 1 puff every twelve  hours   ALBUTEROL (PROAIR HFA) 108 (90 BASE) MCG/ACT INHALER    Inhale 1-2 puffs into the lungs every 6 (six) hours as needed for wheezing or shortness of breath.   BUTORPHANOL (STADOL) 10 MG/ML NASAL  SPRAY    instill 1 spray in 1 nostril every 4 to 6 hours if needed   CETIRIZINE HCL (ZYRTEC ALLERGY) 10 MG CAPS    Take by mouth.   FLUTICASONE (FLONASE) 50 MCG/ACT NASAL SPRAY    Place 2 sprays into both nostrils daily.   OXYCODONE-ACETAMINOPHEN (PERCOCET/ROXICET) 5-325 MG TABLET    Take 1 tablet by mouth every 4 (four) hours as needed for severe pain (migraine).    Review of Systems  Constitutional: Positive for fatigue. Negative for fever, chills, diaphoresis, activity change, appetite change, irritability and unexpected weight change.  Respiratory: Negative.  Negative for shortness of breath.   Cardiovascular: Negative.  Negative for chest pain and palpitations.  Gastrointestinal: Negative.  Negative for nausea.  Genitourinary: Negative for impotence.  Neurological: Negative for dizziness.  Psychiatric/Behavioral: Negative for suicidal ideas, hallucinations, behavioral problems, confusion, sleep disturbance, self-injury, dysphoric mood, decreased concentration and agitation. The patient is nervous/anxious. The patient does not have insomnia and is not hyperactive.     Social History  Substance Use Topics  . Smoking status: Never Smoker   . Smokeless tobacco: Never Used  . Alcohol Use: 0.0 oz/week    0 Standard drinks or equivalent per week     Comment: Rare   Objective:   BP 124/70 mmHg  Pulse 72  Temp(Src) 98.7 F (37.1 C) (Oral)  Resp 16  Wt 168 lb (76.204 kg)  Physical Exam  Constitutional: She is oriented to person, place, and time. She appears well-developed and well-nourished.  Cardiovascular: Normal rate and regular rhythm.   Pulmonary/Chest: Effort normal and  breath sounds normal.  Neurological: She is alert and oriented to person, place, and time.  Skin: Skin is warm and dry.  Psychiatric: She has a normal mood and affect. Her behavior is normal. Judgment and thought content normal.      Assessment & Plan:     1. Nonintractable migraine, unspecified migraine  type Stable, but still has 5-7 migraines a month.  Will refer to Neurology to evaluate and treat.   - Ambulatory referral to Neurology - oxyCODONE-acetaminophen (PERCOCET/ROXICET) 5-325 MG tablet; Take 1 tablet by mouth every 4 (four) hours as needed for severe pain (migraine). May refill on 01/08/2016  Dispense: 60 tablet; Refill: 0 - butorphanol (STADOL) 10 MG/ML nasal spray; instill 1 spray in 1 nostril every 4 to 6 hours if needed  Dispense: 7.5 mL; Refill: 1  2. Panic attack Stable; refills provided.  - ALPRAZolam (XANAX) 0.5 MG tablet; Take 1 tablet (0.5 mg total) by mouth 2 (two) times daily as needed for anxiety.  Dispense: 180 tablet; Refill: 1 - escitalopram (LEXAPRO) 20 MG tablet; Take 1 tablet (20 mg total) by mouth daily.  Dispense: 90 tablet; Refill: 1  3. Cannot sleep Stable; refills provided.  - zolpidem (AMBIEN) 10 MG tablet; Take 1 tablet (10 mg total) by mouth at bedtime.  Dispense: 90 tablet; Refill: 1  Patient was seen and examined by Jerrell Belfast, MD, and note scribed by Ashley Royalty, CMA.   I have reviewed the document for accuracy and completeness and I agree with above. - Jerrell Belfast, MD     Margarita Rana, MD  [facility]

## 2016-01-26 ENCOUNTER — Telehealth: Payer: Self-pay

## 2016-01-26 ENCOUNTER — Encounter: Payer: Self-pay | Admitting: Family Medicine

## 2016-01-26 NOTE — Telephone Encounter (Signed)
Amy from Dr. Barbarann Ehlers office at [facility] called and reports that pt established with Dr. Kary Kos on 11/08/2015. Was receiving opioid refills from that office. Pt was also following up with Dr. Venia Minks before she moved to [place]. LOV with Dr. Venia Minks was 12/27/2015, where pt's opioids and controlled medications were reordered. Advised office manager of this problem. Pt does ov OV scheduled on 02/07/2016 with Dr. Caryn Section. Dr. Caryn Section will decide if pt needs to be dismissed from this practice. Renaldo Fiddler, CMA

## 2016-02-07 ENCOUNTER — Ambulatory Visit: Payer: 59 | Admitting: Family Medicine

## 2016-02-08 ENCOUNTER — Ambulatory Visit: Payer: 59 | Admitting: Family Medicine

## 2016-02-09 ENCOUNTER — Ambulatory Visit (INDEPENDENT_AMBULATORY_CARE_PROVIDER_SITE_OTHER): Payer: 59 | Admitting: Family Medicine

## 2016-02-09 ENCOUNTER — Encounter: Payer: Self-pay | Admitting: Family Medicine

## 2016-02-09 VITALS — BP 120/70 | HR 82 | Temp 98.7°F | Resp 16 | Wt 170.0 lb

## 2016-02-09 DIAGNOSIS — R519 Headache, unspecified: Secondary | ICD-10-CM

## 2016-02-09 DIAGNOSIS — R51 Headache: Secondary | ICD-10-CM | POA: Diagnosis not present

## 2016-02-09 DIAGNOSIS — F1120 Opioid dependence, uncomplicated: Secondary | ICD-10-CM

## 2016-02-09 DIAGNOSIS — F419 Anxiety disorder, unspecified: Secondary | ICD-10-CM

## 2016-02-09 MED ORDER — VENLAFAXINE HCL ER 75 MG PO CP24: 75.0000 mg | ORAL_CAPSULE | Freq: Every day | ORAL | Status: DC

## 2016-02-09 MED ORDER — KETOROLAC TROMETHAMINE 10 MG PO TABS: ORAL_TABLET | ORAL | Status: DC

## 2016-02-09 MED ORDER — CLONIDINE HCL 0.1 MG PO TABS: 0.1000 mg | ORAL_TABLET | Freq: Three times a day (TID) | ORAL | Status: DC | PRN

## 2016-02-09 MED ORDER — ONDANSETRON HCL 4 MG PO TABS: 4.0000 mg | ORAL_TABLET | Freq: Three times a day (TID) | ORAL | Status: DC | PRN

## 2016-02-09 NOTE — Progress Notes (Signed)
Patient: Colleen Sloan Female    DOB: [birth year]   45 y.o.   MRN: [id number] Visit Date: 02/09/2016  Today's Provider: Lelon Huh, MD   No chief complaint on file.  Subjective:    HPI  Patient is a very pleasant patient previously followed by Dr. Venia Minks who presents for follow up of chronic headaches. She states that she was followed by neurologist many years ago and failed treatment with many different abortive and prophylactic medications, and that the only thing her neurologist prescribed that worked was oxycodone/apap and stadol nosesprays. Patient states she get 5-7 migraines a month, and she has to take two oxycodone/apap at a time a few times a day, for a few days with each headache.  She states that she has cut way back on use stadol due to Dr. Venia Minks concerns. At her last visit in June she had some new neurological symptoms and was advised by Dr. Venia Minks to see a neurologist. Review of her prescription records show that she has been consistently refilling 60 oxycodone/apap and stadol nasal sprays nearly every 30 days for several years.   Patient made appointment and was seen by Dr. Kary Kos at [place] on 11/08/15 and was prescribed Levaquin and hydrocodone cough syrup. Patient today states that she only meant to see him to be tested for hemochromatosis due to family history. She then got a refill for 60 oxycodone/apap from Dr. Venia Minks on 11/11/15, another prescription for 30 from Dr. Kary Kos on 11/24/2015, and a prescription for 60 tablets from Dr. Venia Minks on 12/08/2015. She had follow up o.v. with Dr. Venia Minks on 12/27/2015 and was given prescription for 60 tablets, and called Dr. Gorden Harms the very next day and was prescribed 30 more tablets. The patient states it was all just a big misunderstanding and that she didn't really want to transfer to Dr. Chrystine Oiler, but she didn't know if she would be able to continue to be prescribed her medication from this office.    A review  of [place] shows that she gets a few prescriptions of other opioid medications each year from other providers such as codeine and hydrocodone based cough syrups, tramadol, and hydrocodone/apap tablets.   Allergies  Allergen Reactions  . Augmentin [Amoxicillin-Pot Clavulanate] Anaphylaxis   Current Meds  Medication Sig  . ADVAIR DISKUS 250-50 MCG/DOSE AEPB Inhale 1 puff every twelve  hours  . albuterol (PROAIR HFA) 108 (90 BASE) MCG/ACT inhaler Inhale 1-2 puffs into the lungs every 6 (six) hours as needed for wheezing or shortness of breath.  . ALPRAZolam (XANAX) 0.5 MG tablet Take 1 tablet (0.5 mg total) by mouth 2 (two) times daily as needed for anxiety.  . butorphanol (STADOL) 10 MG/ML nasal spray instill 1 spray in 1 nostril every 4 to 6 hours if needed  . Cetirizine HCl (ZYRTEC ALLERGY) 10 MG CAPS Take by mouth.  . Cholecalciferol (VITAMIN D PO) Take by mouth.  . escitalopram (LEXAPRO) 20 MG tablet Take 1 tablet (20 mg total) by mouth daily.  . fluticasone (FLONASE) 50 MCG/ACT nasal spray Place 2 sprays into both nostrils daily.  Marland Kitchen MAGNESIUM PO Take by mouth.  . oxyCODONE-acetaminophen (PERCOCET/ROXICET) 5-325 MG tablet Take 1 tablet by mouth every 4 (four) hours as needed for severe pain (migraine). May refill on 01/08/2016  . zolpidem (AMBIEN) 10 MG tablet Take 1 tablet (10 mg total) by mouth at bedtime.   Current Facility-Administered Medications for the 02/09/16 encounter (Office Visit) with Birdie Sons, MD  Medication  . levonorgestrel (MIRENA) 20 MCG/24HR IUD    Review of Systems  Constitutional: Negative for fever, chills, appetite change and fatigue.  Respiratory: Negative for chest tightness and shortness of breath.   Cardiovascular: Negative for chest pain and palpitations.  Gastrointestinal: Negative for nausea, vomiting and abdominal pain.  Neurological: Negative for dizziness and weakness.    Social History  Substance Use Topics  . Smoking status: Never Smoker     . Smokeless tobacco: Never Used  . Alcohol Use: 0.0 oz/week    0 Standard drinks or equivalent per week     Comment: Rare   Objective:   BP 120/70 mmHg  Pulse 82  Temp(Src) 98.7 F (37.1 C) (Oral)  Resp 16  Wt 170 lb (77.111 kg)  SpO2 98%  Physical Exam  General appearance: alert, well developed, well nourished, cooperative and in no distress Head: Normocephalic, without obvious abnormality, atraumatic Respiratory: Respirations even and unlabored, normal respiratory rate Extremities: No gross deformities Skin: Skin color, texture, turgor normal. No rashes seen  Psych: Appropriate mood and affect. Neurologic: Mental status: Alert, oriented to person, place, and time, thought content appropriate.     Assessment & Plan:     1. Intractable headache, unspecified chronicity pattern, unspecified headache type Patient was counseled extensively on signs of opioid dependence. She has been on consistent dose of oxycodone/apap for several years, but her recent behavior is very worrisome for drug seeking. I advised her that I think she may have become dependent on medications and I am not willing to prescribe any narcotic medication to her. I explained that if she is truly only taking medication intermittently, as she reports, that she should not have any withdrawal symptoms. She has not been taking a high enough dose to require weaning. However she is still very concerned about withdrawel, so she was given rx for clonidine to take prn. She states she has taken ketorolac shots in the past for migraines which worked well so I gave her rx for PO ketorolac and strongly counseled her regarding potential GI toxicity, and not to take it excessively. She states zofran works well for nausea so i gave her a prescription for this. She is very interested pursing non-narcotic treatments such as Botux so we will initiate referral to another headache clinic.   She expressed clear understanding of my concerns  and agreed with the plan we discussed without argument.   - AMB referral to headache clinic - ondansetron (ZOFRAN) 4 MG tablet; Take 1 tablet (4 mg total) by mouth every 8 (eight) hours as needed for nausea or vomiting.  Dispense: 30 tablet; Refill: 1 - ketorolac (TORADOL) 10 MG tablet; Take 1-2 tablets at the onset of headache. No more than 4 tablets in a day  Dispense: 20 tablet; Refill: 1  2. Uncomplicated opioid dependence (HCC)  - cloNIDine (CATAPRES) 0.1 MG tablet; Take 1-2 tablets (0.1-0.2 mg total) by mouth 3 (three) times daily as needed (nervousness, racing heart, or malaise).  Dispense: 30 tablet; Refill: 1  3. Acute anxiety She states her anxiety level has been much higher lately, and that escitalopram is no longer working. She would like to change to a different anxiety medication.  - venlafaxine XR (EFFEXOR XR) 75 MG 24 hr capsule; Take 1 capsule (75 mg total) by mouth daily with breakfast.  Dispense: 30 capsule; Refill: 2   Follow up in a month regarding medication changes.       Lelon Huh, MD  [facility]  [facility]  

## 2016-02-09 NOTE — Patient Instructions (Addendum)
   I recommend you contact [facility] at [phone] regarding opioid dependence   I recommend you cantact one of the following clinics for management of headaches  [facility] in [place], [place] at [phone]  [facility] in [place], [place] at [phone]

## 2016-02-10 DIAGNOSIS — F419 Anxiety disorder, unspecified: Secondary | ICD-10-CM | POA: Insufficient documentation

## 2016-02-16 ENCOUNTER — Encounter: Payer: Self-pay | Admitting: Family Medicine

## 2016-03-08 ENCOUNTER — Other Ambulatory Visit: Payer: Self-pay | Admitting: Family Medicine

## 2016-03-08 DIAGNOSIS — F1120 Opioid dependence, uncomplicated: Secondary | ICD-10-CM

## 2016-03-21 ENCOUNTER — Other Ambulatory Visit: Payer: Self-pay | Admitting: Family Medicine

## 2016-03-21 DIAGNOSIS — F1120 Opioid dependence, uncomplicated: Secondary | ICD-10-CM

## 2016-03-21 NOTE — Telephone Encounter (Signed)
LAST OV 02/09/16. Last filled on 03/09/16.

## 2016-03-30 ENCOUNTER — Other Ambulatory Visit: Payer: Self-pay

## 2016-03-30 DIAGNOSIS — G4700 Insomnia, unspecified: Secondary | ICD-10-CM

## 2016-03-30 DIAGNOSIS — F410 Panic disorder [episodic paroxysmal anxiety] without agoraphobia: Secondary | ICD-10-CM

## 2016-03-30 MED ORDER — ALPRAZOLAM 0.5 MG PO TABS
0.5000 mg | ORAL_TABLET | Freq: Two times a day (BID) | ORAL | 1 refills | Status: DC | PRN
Start: 2016-03-30 — End: 2016-04-02

## 2016-03-30 MED ORDER — ZOLPIDEM TARTRATE 10 MG PO TABS: 10.0000 mg | ORAL_TABLET | Freq: Every day | ORAL | 1 refills | Status: DC

## 2016-03-30 NOTE — Telephone Encounter (Signed)
Please call in alprazolam and zolpidem

## 2016-03-30 NOTE — Telephone Encounter (Signed)
Done-aa 

## 2016-04-02 ENCOUNTER — Other Ambulatory Visit: Payer: Self-pay | Admitting: *Deleted

## 2016-04-02 DIAGNOSIS — G4700 Insomnia, unspecified: Secondary | ICD-10-CM

## 2016-04-02 DIAGNOSIS — F410 Panic disorder [episodic paroxysmal anxiety] without agoraphobia: Secondary | ICD-10-CM

## 2016-04-02 MED ORDER — ALPRAZOLAM 0.5 MG PO TABS: 0.5000 mg | ORAL_TABLET | Freq: Two times a day (BID) | ORAL | 1 refills | Status: DC | PRN

## 2016-04-02 MED ORDER — ZOLPIDEM TARTRATE 10 MG PO TABS: 10.0000 mg | ORAL_TABLET | Freq: Every day | ORAL | 1 refills | Status: DC

## 2016-04-02 NOTE — Telephone Encounter (Signed)
Please call in alprazolam and zolpidem

## 2016-04-03 NOTE — Telephone Encounter (Signed)
Rx called in to pharmacy. 

## 2016-04-17 ENCOUNTER — Other Ambulatory Visit: Payer: Self-pay

## 2016-04-17 DIAGNOSIS — J45909 Unspecified asthma, uncomplicated: Secondary | ICD-10-CM

## 2016-04-17 MED ORDER — FLUTICASONE-SALMETEROL 250-50 MCG/DOSE IN AEPB: 1.0000 | INHALATION_SPRAY | Freq: Two times a day (BID) | RESPIRATORY_TRACT | 5 refills | Status: DC

## 2016-04-17 NOTE — Telephone Encounter (Signed)
Pharmacy requesting refills.

## 2016-04-17 NOTE — Telephone Encounter (Signed)
Pt called and is out of her advair.  She needs a refill asap.

## 2016-04-17 NOTE — Telephone Encounter (Signed)
Please review-aa 

## 2016-05-13 ENCOUNTER — Other Ambulatory Visit: Payer: Self-pay | Admitting: Family Medicine

## 2016-05-13 DIAGNOSIS — F1120 Opioid dependence, uncomplicated: Secondary | ICD-10-CM

## 2016-06-25 ENCOUNTER — Other Ambulatory Visit: Payer: Self-pay | Admitting: *Deleted

## 2016-06-25 DIAGNOSIS — F419 Anxiety disorder, unspecified: Secondary | ICD-10-CM

## 2016-06-25 MED ORDER — VENLAFAXINE HCL ER 75 MG PO CP24: 75.0000 mg | ORAL_CAPSULE | Freq: Every day | ORAL | 4 refills | Status: DC

## 2016-08-09 ENCOUNTER — Encounter: Payer: 59 | Admitting: Gynecology

## 2016-08-09 ENCOUNTER — Other Ambulatory Visit: Payer: Self-pay | Admitting: Family Medicine

## 2016-08-09 DIAGNOSIS — F1120 Opioid dependence, uncomplicated: Secondary | ICD-10-CM

## 2016-08-20 ENCOUNTER — Other Ambulatory Visit: Payer: Self-pay | Admitting: *Deleted

## 2016-08-20 DIAGNOSIS — F410 Panic disorder [episodic paroxysmal anxiety] without agoraphobia: Secondary | ICD-10-CM

## 2016-08-20 MED ORDER — ZOLPIDEM TARTRATE 10 MG PO TABS: 10.0000 mg | ORAL_TABLET | Freq: Every day | ORAL | 1 refills | Status: DC

## 2016-08-20 MED ORDER — ALPRAZOLAM 0.5 MG PO TABS: 0.5000 mg | ORAL_TABLET | Freq: Two times a day (BID) | ORAL | 1 refills | Status: DC | PRN

## 2016-10-16 ENCOUNTER — Other Ambulatory Visit: Payer: Self-pay | Admitting: *Deleted

## 2016-10-16 DIAGNOSIS — F1120 Opioid dependence, uncomplicated: Secondary | ICD-10-CM

## 2016-10-16 MED ORDER — CLONIDINE HCL 0.1 MG PO TABS: ORAL_TABLET | ORAL | 2 refills | Status: DC

## 2016-12-21 ENCOUNTER — Other Ambulatory Visit: Payer: Self-pay | Admitting: Family Medicine

## 2016-12-21 DIAGNOSIS — F1120 Opioid dependence, uncomplicated: Secondary | ICD-10-CM

## 2016-12-22 NOTE — Telephone Encounter (Signed)
LOV 02/09/2016. Colleen Sloan, CMA  

## 2017-01-28 ENCOUNTER — Other Ambulatory Visit: Payer: Self-pay | Admitting: Family Medicine

## 2017-01-28 DIAGNOSIS — F410 Panic disorder [episodic paroxysmal anxiety] without agoraphobia: Secondary | ICD-10-CM

## 2017-01-28 MED ORDER — ZOLPIDEM TARTRATE 10 MG PO TABS: 10.0000 mg | ORAL_TABLET | Freq: Every day | ORAL | 0 refills | Status: DC

## 2017-01-28 MED ORDER — ALPRAZOLAM 0.5 MG PO TABS: 0.5000 mg | ORAL_TABLET | Freq: Two times a day (BID) | ORAL | 0 refills | Status: DC | PRN

## 2017-01-28 NOTE — Telephone Encounter (Signed)
[facility] faxed a request on the following medications. Thanks CC  ALPRAZolam (XANAX) 0.5 MG tablet   zolpidem (AMBIEN) 10 MG tablet

## 2017-01-28 NOTE — Telephone Encounter (Signed)
Patient has not been seen in nearly a year. OK to cal lin 90 day prescription alprazolam and zopidem but needs to schedule appointment in the next 1-2 months.

## 2017-01-29 NOTE — Telephone Encounter (Signed)
Roshena, have this been called in? I can if you haven't. Appt has been scheduled.

## 2017-01-29 NOTE — Telephone Encounter (Signed)
Tried calling patient. Left message to call back. 

## 2017-01-29 NOTE — Telephone Encounter (Signed)
Pt scheduled appt./  tp

## 2017-01-30 NOTE — Telephone Encounter (Signed)
Please review-aa 

## 2017-01-31 NOTE — Telephone Encounter (Signed)
Called into pharmacy

## 2017-03-12 ENCOUNTER — Ambulatory Visit: Payer: 59 | Admitting: Family Medicine

## 2017-04-01 ENCOUNTER — Ambulatory Visit (INDEPENDENT_AMBULATORY_CARE_PROVIDER_SITE_OTHER): Payer: 59 | Admitting: Family Medicine

## 2017-04-01 ENCOUNTER — Encounter: Payer: Self-pay | Admitting: Family Medicine

## 2017-04-01 VITALS — BP 106/82 | HR 82 | Temp 98.6°F | Resp 16 | Wt 166.0 lb

## 2017-04-01 DIAGNOSIS — D509 Iron deficiency anemia, unspecified: Secondary | ICD-10-CM

## 2017-04-01 DIAGNOSIS — Z23 Encounter for immunization: Secondary | ICD-10-CM | POA: Diagnosis not present

## 2017-04-01 DIAGNOSIS — E559 Vitamin D deficiency, unspecified: Secondary | ICD-10-CM | POA: Insufficient documentation

## 2017-04-01 DIAGNOSIS — F419 Anxiety disorder, unspecified: Secondary | ICD-10-CM | POA: Diagnosis not present

## 2017-04-01 DIAGNOSIS — F410 Panic disorder [episodic paroxysmal anxiety] without agoraphobia: Secondary | ICD-10-CM | POA: Diagnosis not present

## 2017-04-01 MED ORDER — ZOLPIDEM TARTRATE 10 MG PO TABS
10.0000 mg | ORAL_TABLET | Freq: Every day | ORAL | 1 refills | Status: DC
Start: 2017-04-01 — End: 2017-09-26

## 2017-04-01 MED ORDER — ALPRAZOLAM 0.5 MG PO TABS: 0.5000 mg | ORAL_TABLET | Freq: Two times a day (BID) | ORAL | 5 refills | Status: DC | PRN

## 2017-04-01 NOTE — Progress Notes (Signed)
Patient: Colleen Sloan Female    DOB: May 15, 1971   46 y.o.   MRN: [id number] Visit Date: 04/01/2017  Today's Provider: Lelon Huh, MD   Chief Complaint  Patient presents with  . Migraine    follow up  . Panic Attack    follow up   Subjective:    HPI Follow up of Migraines:  Patient was last seen for this problem 1 year ago and was referred to the headache clinic.Today patient comes in reporting that her migraines are stable.    Follow up of Anxiety:  Patient was last seen for this problem 1 year ago. Changes made during that visit includes changing to Effexor. Patient reports good compliance with treatment, good tolerance and fair symptom control. Takes alprazolam every morning at work. Takes zolpidem every night. Takes clonidine occasionally, usually takes two at a time. Was initially prescribed for opioid withdrawal, but states it helps with her anxiety  She reports she low vitamin d last year at Dr. Adolphus Birchwood. Is taking daily vitamin d supplements.     Allergies  Allergen Reactions  . Augmentin [Amoxicillin-Pot Clavulanate] Anaphylaxis     Current Outpatient Prescriptions:  .  albuterol (PROAIR HFA) 108 (90 BASE) MCG/ACT inhaler, Inhale 1-2 puffs into the lungs every 6 (six) hours as needed for wheezing or shortness of breath., Disp: , Rfl:  .  ALPRAZolam (XANAX) 0.5 MG tablet, Take 1 tablet (0.5 mg total) by mouth 2 (two) times daily as needed for anxiety., Disp: 180 tablet, Rfl: 0 .  Cetirizine HCl (ZYRTEC ALLERGY) 10 MG CAPS, Take by mouth., Disp: , Rfl:  .  Cholecalciferol (VITAMIN D PO), Take by mouth., Disp: , Rfl:  .  cloNIDine (CATAPRES) 0.1 MG tablet, TAKE 1-2 TABLETS BY MOUTH  THREE TIMES A DAY IF NEEDED, Disp: 180 tablet, Rfl: 0 .  fluticasone (FLONASE) 50 MCG/ACT nasal spray, Place 2 sprays into both nostrils daily., Disp: , Rfl:  .  Fluticasone-Salmeterol (ADVAIR DISKUS) 250-50 MCG/DOSE AEPB, Inhale 1 puff into the lungs 2 (two) times  daily., Disp: 60 each, Rfl: 5 .  ketorolac (TORADOL) 10 MG tablet, Take 1-2 tablets at the onset of headache. No more than 4 tablets in a day, Disp: 20 tablet, Rfl: 1 .  venlafaxine XR (EFFEXOR XR) 75 MG 24 hr capsule, Take 1 capsule (75 mg total) by mouth daily with breakfast., Disp: 90 capsule, Rfl: 4 .  zolpidem (AMBIEN) 10 MG tablet, Take 1 tablet (10 mg total) by mouth at bedtime., Disp: 90 tablet, Rfl: 0  Current Facility-Administered Medications:  .  levonorgestrel (MIRENA) 20 MCG/24HR IUD, , Intrauterine, Once, Terrance Mass, MD  Review of Systems  Constitutional: Negative for appetite change, chills, fatigue and fever.  Respiratory: Negative for chest tightness and shortness of breath.   Cardiovascular: Negative for chest pain and palpitations.  Gastrointestinal: Negative for abdominal pain, nausea and vomiting.  Musculoskeletal: Positive for arthralgias.  Neurological: Positive for headaches. Negative for dizziness and weakness.    Social History  Substance Use Topics  . Smoking status: Never Smoker  . Smokeless tobacco: Never Used  . Alcohol use No   Objective:   BP 106/82 (BP Location: Left Arm, Patient Position: Sitting, Cuff Size: Normal)   Pulse 82   Temp 98.6 F (37 C) (Oral)   Resp 16   Wt 166 lb (75.3 kg)   SpO2 98% Comment: room air  BMI 27.62 kg/m  There were no vitals filed for this  visit.   Physical Exam   General Appearance:    Alert, cooperative, no distress  Eyes:    PERRL, conjunctiva/corneas clear, EOM's intact       Lungs:     Clear to auscultation bilaterally, respirations unlabored  Heart:    Regular rate and rhythm  Neurologic:   Awake, alert, oriented x 3. No apparent focal neurological           defect.           Assessment & Plan:       1. Acute anxiety Doing well with current medications.   2. Vitamin D deficiency  - VITAMIN D 25 Hydroxy (Vit-D Deficiency, Fractures)  3. Panic attack Continue - ALPRAZolam (XANAX) 0.5 MG  tablet; Take 1 tablet (0.5 mg total) by mouth 2 (two) times daily as needed for anxiety.  Dispense: 180 tablet; Refill: 5  4. Iron deficiency anemia, unspecified iron deficiency anemia type  - Ferritin - CBC  5. Need for tetanus, diphtheria, and acellular pertussis (Tdap) vaccine  - Tdap vaccine greater than or equal to 7yo IM  6. Need for influenza vaccination  - Flu Vaccine QUAD 6+ mos PF IM (Fluarix Quad PF)   Lelon Huh, MD  [facility]

## 2017-04-02 LAB — CBC
HEMATOCRIT: 43.3 % (ref 34.0–46.6)
Hemoglobin: 14.5 g/dL (ref 11.1–15.9)
MCH: 30.8 pg (ref 26.6–33.0)
MCHC: 33.5 g/dL (ref 31.5–35.7)
MCV: 92 fL (ref 79–97)
Platelets: 310 10*3/uL (ref 150–379)
RBC: 4.71 x10E6/uL (ref 3.77–5.28)
RDW: 13.1 % (ref 12.3–15.4)
WBC: 5.9 10*3/uL (ref 3.4–10.8)

## 2017-04-02 LAB — VITAMIN D 25 HYDROXY (VIT D DEFICIENCY, FRACTURES): Vit D, 25-Hydroxy: 38.1 ng/mL (ref 30.0–100.0)

## 2017-04-02 LAB — FERRITIN: Ferritin: 172 ng/mL — ABNORMAL HIGH (ref 15–150)

## 2017-05-09 ENCOUNTER — Ambulatory Visit
Admission: EM | Admit: 2017-05-09 | Discharge: 2017-05-09 | Disposition: A | Discharge status: Home / Self Care | Payer: 59 | Attending: Family Medicine | Admitting: Family Medicine

## 2017-05-09 ENCOUNTER — Encounter: Payer: Self-pay | Admitting: *Deleted

## 2017-05-09 DIAGNOSIS — K625 Hemorrhage of anus and rectum: Secondary | ICD-10-CM | POA: Diagnosis not present

## 2017-05-09 DIAGNOSIS — R634 Abnormal weight loss: Secondary | ICD-10-CM

## 2017-05-09 DIAGNOSIS — R5383 Other fatigue: Secondary | ICD-10-CM | POA: Diagnosis not present

## 2017-05-09 LAB — CBC WITH DIFFERENTIAL/PLATELET
BASOS ABS: 0.1 10*3/uL (ref 0–0.1)
BASOS PCT: 1 %
EOS ABS: 0.1 10*3/uL (ref 0–0.7)
EOS PCT: 2 %
HCT: 38.9 % (ref 35.0–47.0)
Hemoglobin: 13.3 g/dL (ref 12.0–16.0)
Lymphocytes Relative: 21 %
Lymphs Abs: 1.3 10*3/uL (ref 1.0–3.6)
MCH: 31 pg (ref 26.0–34.0)
MCHC: 34.1 g/dL (ref 32.0–36.0)
MCV: 90.9 fL (ref 80.0–100.0)
MONO ABS: 0.5 10*3/uL (ref 0.2–0.9)
Monocytes Relative: 9 %
Neutro Abs: 4.1 10*3/uL (ref 1.4–6.5)
Neutrophils Relative %: 67 %
PLATELETS: 227 10*3/uL (ref 150–440)
RBC: 4.28 MIL/uL (ref 3.80–5.20)
RDW: 12.7 % (ref 11.5–14.5)
WBC: 6 10*3/uL (ref 3.6–11.0)

## 2017-05-09 LAB — URINALYSIS, ROUTINE W REFLEX MICROSCOPIC
Bilirubin Urine: NEGATIVE
GLUCOSE, UA: NEGATIVE mg/dL
Hgb urine dipstick: NEGATIVE
Ketones, ur: NEGATIVE mg/dL
LEUKOCYTES UA: NEGATIVE
Nitrite: NEGATIVE
PROTEIN: NEGATIVE mg/dL
SPECIFIC GRAVITY, URINE: 1.02 (ref 1.005–1.030)
pH: 6 (ref 5.0–8.0)

## 2017-05-09 LAB — COMPREHENSIVE METABOLIC PANEL
ALBUMIN: 4.3 g/dL (ref 3.5–5.0)
ALT: 12 U/L — ABNORMAL LOW (ref 14–54)
ANION GAP: 5 (ref 5–15)
AST: 19 U/L (ref 15–41)
Alkaline Phosphatase: 59 U/L (ref 38–126)
BILIRUBIN TOTAL: 0.7 mg/dL (ref 0.3–1.2)
BUN: 11 mg/dL (ref 6–20)
CHLORIDE: 106 mmol/L (ref 101–111)
CO2: 27 mmol/L (ref 22–32)
Calcium: 8.8 mg/dL — ABNORMAL LOW (ref 8.9–10.3)
Creatinine, Ser: 0.72 mg/dL (ref 0.44–1.00)
GFR calc Af Amer: >60 mL/min (ref >60)
GFR calc non Af Amer: >60 mL/min (ref >60)
GLUCOSE: 93 mg/dL (ref 65–99)
POTASSIUM: 3.9 mmol/L (ref 3.5–5.1)
SODIUM: 138 mmol/L (ref 135–145)
Total Protein: 7.2 g/dL (ref 6.5–8.1)

## 2017-05-09 LAB — SEDIMENTATION RATE: SED RATE: 2 mm/h (ref 0–20)

## 2017-05-09 NOTE — ED Provider Notes (Signed)
[facility]    CSN: [id number] Arrival date & time: 05/09/17  1621   History   Chief Complaint Chief Complaint  Patient presents with  . Weakness  . Weight Loss  . Bleeding/Bruising   HPI 46 year old female presents with the above complaints.  Patient states that for the past month she's had unintentional weight loss. She feels like it's approximately 18 pounds. However, her last weight was 166 at her primary care office. Weight today is 159. She reports associated bruising that is unexplained. Bruises on her lower legs and trunk. Associated fatigue as well. Additionally, she's had intermittent bright red blood per rectum. She states that this is happened several times. No fever. No night sweats. Decreased appetite. No early satiety. No known inciting factor. No known exacerbating or relieving factors. She had blood work done approximately 1 month ago.  Her preventative care is not up-to-date. She has not had a mammogram. Pap smear up-to-date. Has not had a colonoscopy since her 40s. No reports of lymphadenopathy. No palpable masses of her breasts. She does note they have been tender.  Past Medical History:  Diagnosis Date  . Asthma   . Migraines    TAKES TYLOX PRN   Patient Active Problem List   Diagnosis Date Noted  . Vitamin D deficiency 04/01/2017  . Acute anxiety 02/10/2016  . Allergic rhinitis 12/30/2014  . Insomnia 12/30/2014  . Panic disorder 12/30/2014  . RAD (reactive airway disease) 12/30/2014  . Headache, migraine 12/24/2014  . Migraines    Past Surgical History:  Procedure Laterality Date  . ANKLE SURGERY Right 2003   Reconstruct Ligaments  . DILATION AND CURETTAGE OF UTERUS  1996  . HAND SURGERY  1993  . INTRAUTERINE DEVICE INSERTION  04/02/2014   [place], 2001   X 2 Removal of Cysts  . Thumb surg     OB History    Gravida Para Term Preterm AB Living   5 3     2 3    SAB TAB Ectopic Multiple Live Births       Home Medications    Prior to Admission medications   Medication Sig Start Date End Date Taking? Authorizing Provider  ALPRAZolam Duanne Moron) 0.5 MG tablet Take 1 tablet (0.5 mg total) by mouth 2 (two) times daily as needed for anxiety. 04/01/17  Yes Birdie Sons, MD  Cholecalciferol (VITAMIN D PO) Take 2,000 mg by mouth daily.    Yes [provider]  cloNIDine (CATAPRES) 0.1 MG tablet TAKE 1-2 TABLETS BY MOUTH  THREE TIMES A DAY IF NEEDED 12/23/16  Yes Birdie Sons, MD  venlafaxine XR (EFFEXOR XR) 75 MG 24 hr capsule Take 1 capsule (75 mg total) by mouth daily with breakfast. 06/25/16  Yes Birdie Sons, MD  zolpidem (AMBIEN) 10 MG tablet Take 1 tablet (10 mg total) by mouth at bedtime. 04/01/17  Yes Birdie Sons, MD  albuterol (PROAIR HFA) 108 (90 BASE) MCG/ACT inhaler Inhale 1-2 puffs into the lungs every 6 (six) hours as needed for wheezing or shortness of breath.    [provider]  Cetirizine HCl (ZYRTEC ALLERGY) 10 MG CAPS Take by mouth.    [provider]  fluticasone (FLONASE) 50 MCG/ACT nasal spray Place 2 sprays into both nostrils daily.    [provider]  ketorolac (TORADOL) 10 MG tablet Take 1-2 tablets at the onset of headache. No more than 4 tablets in a day 02/09/16   Caryn Section,  Kirstie Peri, MD   Family History Family History  Problem Relation Age of Onset  . Hypertension Father   . Hemachromatosis Father   . Depression Mother   . Anxiety disorder Mother   . Bipolar disorder Sister   . Mental illness Sister   . Bipolar disorder Maternal Aunt   . Cancer Maternal Aunt        BRAIN  . Other Maternal Grandmother        ANXIETY  . Asthma Maternal Grandmother   . Cancer Paternal Grandmother        LIVER   Social History Social History  Substance Use Topics  . Smoking status: Never Smoker  . Smokeless tobacco: Never Used  . Alcohol use No   Allergies   Augmentin [amoxicillin-pot clavulanate]  Review of Systems Review of  Systems  Constitutional: Positive for appetite change and unexpected weight change. Negative for fever.  Gastrointestinal: Positive for blood in stool. Negative for abdominal pain.   Physical Exam Triage Vital Signs ED Triage Vitals  Enc Vitals Group     BP 05/09/17 1641 123/74     Pulse Rate 05/09/17 1641 72     Resp 05/09/17 1641 16     Temp 05/09/17 1641 98.4 F (36.9 C)     Temp Source 05/09/17 1641 Oral     SpO2 05/09/17 1641 100 %     Weight 05/09/17 1644 153 lb (69.4 kg)     Height 05/09/17 1644 5\' 4"  (1.626 m)     Head Circumference --      Peak Flow --      Pain Score 05/09/17 1644 0     Pain Loc --      Pain Edu? --      Excl. in [place]? --    Updated Vital Signs BP 123/74 (BP Location: Left Arm)   Pulse 72   Temp 98.4 F (36.9 C) (Oral)   Resp 16   Ht 5\' 4"  (1.626 m)   Wt 159 lb (72.1 kg)   SpO2 100%   BMI 27.29 kg/m  Physical Exam  Constitutional: She is oriented to person, place, and time. She appears well-developed. No distress.  HENT:  Head: Normocephalic and atraumatic.  Mouth/Throat: Oropharynx is clear and moist.  Eyes:  Mild pallor of the conjunctiva noted.  Neck: Neck supple. No thyromegaly present.  Cardiovascular: Normal rate and regular rhythm.   No murmur heard. Pulmonary/Chest: Effort normal and breath sounds normal. She has no wheezes. She has no rales.  Abdominal: Soft.  Nondistended. Mild epigastric tenderness.  Lymphadenopathy:    She has no cervical adenopathy.  Neurological: She is oriented to person, place, and time.  Psychiatric: She has a normal mood and affect.  Vitals reviewed.  UC Treatments / Results  Labs (all labs ordered are listed, but only abnormal results are displayed) Labs Reviewed  COMPREHENSIVE METABOLIC PANEL - Abnormal; Notable for the following:       Result Value   Calcium 8.8 (*)    ALT 12 (*)    All other components within normal limits  CBC WITH DIFFERENTIAL/PLATELET  SEDIMENTATION RATE  URINALYSIS,  ROUTINE W REFLEX MICROSCOPIC    EKG  EKG Interpretation None      Radiology No results found.  Procedures Procedures (including critical care time)  Medications Ordered in UC Medications - No data to display  Initial Impression / Assessment and Plan / UC Course  I have reviewed the triage vital signs and the nursing  notes.  Pertinent labs & imaging results that were available during my care of the patient were reviewed by me and considered in my medical decision making (see chart for details).    46 year old female presents with weight loss, fatigue, bruising, rectal bleeding. Labs and UA normal. Uncertain etiology.  Patient deferred rectal exam. Recommend follow up with PCP. Needs Colonoscopy.   Final Clinical Impressions(s) / UC Diagnoses   Final diagnoses:  Unintentional weight loss  Fatigue, unspecified type  BRBPR (bright red blood per rectum)   New Prescriptions Discharge Medication List as of 05/09/2017  6:09 PM     Controlled Substance Prescriptions [place] Controlled Substance Registry consulted? Not Applicable   Coral Spikes, DO 05/09/17 1610

## 2017-05-09 NOTE — ED Triage Notes (Signed)
Patient started having unexplained weight loss 1 month ago. Patient started to experience bruising 1 week ago and weakness 2 weeks ago.

## 2017-05-09 NOTE — Discharge Instructions (Signed)
You need a colonoscopy.  Go get the rest of your labs.  Labs here were normal.  Take care  Dr. Lacinda Axon

## 2017-05-13 ENCOUNTER — Telehealth: Payer: Self-pay | Admitting: Family Medicine

## 2017-05-13 NOTE — Telephone Encounter (Signed)
Pt is requesting an order sent to [facility] to have a mammogram.  CB#[phone]/MW

## 2017-05-13 NOTE — Telephone Encounter (Signed)
Is this for screening or does she have any symptoms. Ok to order screening if she does not have symptoms.

## 2017-05-14 ENCOUNTER — Other Ambulatory Visit: Payer: Self-pay | Admitting: Family Medicine

## 2017-05-14 DIAGNOSIS — Z1231 Encounter for screening mammogram for malignant neoplasm of breast: Secondary | ICD-10-CM

## 2017-05-14 NOTE — Telephone Encounter (Signed)
Patient called Colleen Sloan and she has a screening mammogram scheduled for 05/30/2017.  Patient also is requesting a referral to hematology to rule out a blood disorder that runs in her family. She was seen in the [facility] urgent care last week due to abnormal bruising an abnormal weight loss. She is requesting Dr. Grayland Ormond because he knows her family history.

## 2017-05-14 NOTE — Telephone Encounter (Signed)
Patient just needs a screening mammo.

## 2017-05-14 NOTE — Telephone Encounter (Signed)
Patient wanted to know if we can refer her to Dr. Levan Hurst in oncology. Patient states that her father and sister are both patient's there. Patient wants to have tests done by  Dr. Bolivar Haw to make sure she does not have the same illness as her relations. Patient has had some unusual symptoms lately, such as unexplained burising. Advised pt she will need an office visit to discuss this with provider. Patient is okay with office visit but wanted to check with provider first and see if she can get referral over the phone. Please advise?

## 2017-05-15 NOTE — Telephone Encounter (Signed)
OK to order mammogram. Needs office visit before we can make a referral.

## 2017-05-17 NOTE — Telephone Encounter (Signed)
Pt called to get an update about referral. Pt was advised as below and is scheduled for OV on 05/21/17. Thanks TNP

## 2017-05-21 ENCOUNTER — Encounter: Payer: Self-pay | Admitting: Family Medicine

## 2017-05-21 ENCOUNTER — Ambulatory Visit (INDEPENDENT_AMBULATORY_CARE_PROVIDER_SITE_OTHER): Payer: 59 | Admitting: Family Medicine

## 2017-05-21 VITALS — BP 130/80 | HR 73 | Temp 98.3°F | Resp 16 | Ht 64.0 in | Wt 157.0 lb

## 2017-05-21 DIAGNOSIS — R238 Other skin changes: Secondary | ICD-10-CM | POA: Diagnosis not present

## 2017-05-21 DIAGNOSIS — J301 Allergic rhinitis due to pollen: Secondary | ICD-10-CM

## 2017-05-21 DIAGNOSIS — K921 Melena: Secondary | ICD-10-CM | POA: Diagnosis not present

## 2017-05-21 DIAGNOSIS — R233 Spontaneous ecchymoses: Secondary | ICD-10-CM

## 2017-05-21 DIAGNOSIS — J069 Acute upper respiratory infection, unspecified: Secondary | ICD-10-CM | POA: Diagnosis not present

## 2017-05-21 MED ORDER — FLUTICASONE PROPIONATE 50 MCG/ACT NA SUSP: 2.0000 | Freq: Every day | NASAL | 4 refills | Status: DC

## 2017-05-21 NOTE — Progress Notes (Signed)
Patient: Colleen Sloan Female    DOB: May 19, 1971   46 y.o.   MRN: [id number] Visit Date: 05/21/2017  Today's Provider: Lelon Huh, MD   No chief complaint on file.  Subjective:    HPI  Patient is here to discuss possible referral to hematology, Dr. Grayland Ormond. Patient wants to rule out blood disorder that runs in her family. Patient has symptoms of unexplained bruising and abnormal weight loss. Was seen at [facility] on 05-09-2017  Has family history of hemochromatosis brain cancer, liver cancer, cervical cancer. Several auto-immune diseases Crohn's disease in family  Complains of changes in stool with blood In stool and abnormal bruising for last two weeks. She brings photos of several bruises she states deveoped spontaneously on her arms, legs and trunk.   Scheduled to see GI on 05/23/2017 and mammogram  Followed by Dr. Phineas Real in [place] for pap screening last done in 2016 and was normal  She is concerned about possibility of underlying malignancy, blood disorder or autoimmune disease and requests referral to Dr. Grayland Ormond who provides care for several of her family members.   Wt Readings from Last 3 Encounters:  05/21/17 157 lb (71.2 kg)  05/09/17 159 lb (72.1 kg)  04/01/17 166 lb (75.3 kg)   Results for orders placed or performed during the hospital encounter of 05/09/17  CBC with Differential  Result Value Ref Range   WBC 6.0 3.6 - 11.0 K/uL   RBC 4.28 3.80 - 5.20 MIL/uL   Hemoglobin 13.3 12.0 - 16.0 g/dL   HCT 38.9 35.0 - 47.0 %   MCV 90.9 80.0 - 100.0 fL   MCH 31.0 26.0 - 34.0 pg   MCHC 34.1 32.0 - 36.0 g/dL   RDW 12.7 11.5 - 14.5 %   Platelets 227 150 - 440 K/uL   Neutrophils Relative % 67 %   Neutro Abs 4.1 1.4 - 6.5 K/uL   Lymphocytes Relative 21 %   Lymphs Abs 1.3 1.0 - 3.6 K/uL   Monocytes Relative 9 %   Monocytes Absolute 0.5 0.2 - 0.9 K/uL   Eosinophils Relative 2 %   Eosinophils Absolute 0.1 0 - 0.7 K/uL   Basophils Relative  1 %   Basophils Absolute 0.1 0 - 0.1 K/uL  Sedimentation rate  Result Value Ref Range   Sed Rate 2 0 - 20 mm/hr  Comprehensive metabolic panel  Result Value Ref Range   Sodium 138 135 - 145 mmol/L   Potassium 3.9 3.5 - 5.1 mmol/L   Chloride 106 101 - 111 mmol/L   CO2 27 22 - 32 mmol/L   Glucose, Bld 93 65 - 99 mg/dL   BUN 11 6 - 20 mg/dL   Creatinine, Ser 0.72 0.44 - 1.00 mg/dL   Calcium 8.8 (L) 8.9 - 10.3 mg/dL   Total Protein 7.2 6.5 - 8.1 g/dL   Albumin 4.3 3.5 - 5.0 g/dL   AST 19 15 - 41 U/L   ALT 12 (L) 14 - 54 U/L   Alkaline Phosphatase 59 38 - 126 U/L   Total Bilirubin 0.7 0.3 - 1.2 mg/dL   GFR calc non Af Amer >60 >60 mL/min   GFR calc Af Amer >60 >60 mL/min   Anion gap 5 5 - 15  Urinalysis, Routine w reflex microscopic  Result Value Ref Range   Color, Urine YELLOW YELLOW   APPearance CLEAR CLEAR   Specific Gravity, Urine 1.020 1.005 - 1.030   pH 6.0  5.0 - 8.0   Glucose, UA NEGATIVE NEGATIVE mg/dL   Hgb urine dipstick NEGATIVE NEGATIVE   Bilirubin Urine NEGATIVE NEGATIVE   Ketones, ur NEGATIVE NEGATIVE mg/dL   Protein, ur NEGATIVE NEGATIVE mg/dL   Nitrite NEGATIVE NEGATIVE   Leukocytes, UA NEGATIVE NEGATIVE    She also complains of 3-4 day history sore throat, nasal congestion, sinus congestion and cough. Is taking cetirizine daily but has been out of fluticasone.   Allergies  Allergen Reactions  . Augmentin [Amoxicillin-Pot Clavulanate] Anaphylaxis     Current Outpatient Prescriptions:  .  albuterol (PROAIR HFA) 108 (90 BASE) MCG/ACT inhaler, Inhale 1-2 puffs into the lungs every 6 (six) hours as needed for wheezing or shortness of breath., Disp: , Rfl:  .  ALPRAZolam (XANAX) 0.5 MG tablet, Take 1 tablet (0.5 mg total) by mouth 2 (two) times daily as needed for anxiety., Disp: 180 tablet, Rfl: 5 .  Cetirizine HCl (ZYRTEC ALLERGY) 10 MG CAPS, Take by mouth., Disp: , Rfl:  .  Cholecalciferol (VITAMIN D PO), Take 2,000 mg by mouth daily. , Disp: , Rfl:  .   cloNIDine (CATAPRES) 0.1 MG tablet, TAKE 1-2 TABLETS BY MOUTH  THREE TIMES A DAY IF NEEDED, Disp: 180 tablet, Rfl: 0 .  fluticasone (FLONASE) 50 MCG/ACT nasal spray, Place 2 sprays into both nostrils daily., Disp: , Rfl:  .  ketorolac (TORADOL) 10 MG tablet, Take 1-2 tablets at the onset of headache. No more than 4 tablets in a day, Disp: 20 tablet, Rfl: 1 .  venlafaxine XR (EFFEXOR XR) 75 MG 24 hr capsule, Take 1 capsule (75 mg total) by mouth daily with breakfast., Disp: 90 capsule, Rfl: 4 .  zolpidem (AMBIEN) 10 MG tablet, Take 1 tablet (10 mg total) by mouth at bedtime., Disp: 90 tablet, Rfl: 1  Current Facility-Administered Medications:  .  levonorgestrel (MIRENA) 20 MCG/24HR IUD, , Intrauterine, Once, Terrance Mass, MD  Review of Systems  Constitutional: Positive for unexpected weight change. Negative for appetite change, chills, fatigue and fever.  Respiratory: Negative for chest tightness and shortness of breath.   Cardiovascular: Negative for chest pain and palpitations.  Gastrointestinal: Negative for abdominal pain, nausea and vomiting.  Neurological: Negative for dizziness and weakness.    Social History  Substance Use Topics  . Smoking status: Never Smoker  . Smokeless tobacco: Never Used  . Alcohol use No   Objective:   BP 130/80 (BP Location: Right Arm, Patient Position: Sitting, Cuff Size: Large)   Pulse 73   Temp 98.3 F (36.8 C) (Oral)   Resp 16   Ht 5\' 4"  (1.626 m)   Wt 157 lb (71.2 kg)   SpO2 98%   BMI 26.95 kg/m  Vitals:   05/21/17 1143  BP: 130/80  Pulse: 73  Resp: 16  Temp: 98.3 F (36.8 C)  TempSrc: Oral  SpO2: 98%  Weight: 157 lb (71.2 kg)  Height: 5\' 4"  (1.626 m)     Physical Exam   General Appearance:    Alert, cooperative, no distress  HENT:   bilateral TM normal without fluid or infection, neck has bilateral anterior cervical nodes enlarged, sinuses nontender, post nasal drip noted and nasal mucosa pale and congested  Eyes:     PERRL, conjunctiva/corneas clear, EOM's intact       Lungs:     Clear to auscultation bilaterally, respirations unlabored  Heart:    Regular rate and rhythm  Neurologic:   Awake, alert, oriented x 3. No apparent focal  neurological           defect.            Assessment & Plan:     1. Abnormal bruising Due to multiple family members with malignancies and auto-immune disorders, she requests referral to Dr. Grayland Ormond who knows her family history.  - Ambulatory referral to Hematology  2. Blood in stool She reports she is scheduled to see GI for evaluation.  - Ambulatory referral to Hematology  3. Allergic rhinitis due to pollen, unspecified seasonality  - fluticasone (FLONASE) 50 MCG/ACT nasal spray; Place 2 sprays into both nostrils daily.  Dispense: 16 g; Refill: 4  4. Upper respiratory tract infection, unspecified type Counseled regarding signs and symptoms of viral and bacterial respiratory infections. Advised to call or return for additional evaluation if she develops any sign of bacterial infection, or if current symptoms last longer than 10 days.         Lelon Huh, MD  [facility]

## 2017-05-22 DIAGNOSIS — R233 Spontaneous ecchymoses: Secondary | ICD-10-CM | POA: Insufficient documentation

## 2017-05-22 DIAGNOSIS — R238 Other skin changes: Secondary | ICD-10-CM | POA: Insufficient documentation

## 2017-05-22 NOTE — Progress Notes (Signed)
[facility]  [phone] [phone]  ID: Colleen Sloan OB: Oct 04, 1970  MR#: [id number]  XBM#:[id number]  Patient Care Team: Birdie Sons, MD as PCP - General (Family Medicine) Phineas Real, Belinda Block, MD as Consulting Physician (Gynecology) Lin Landsman, MD as Consulting Physician (Gastroenterology)  CHIEF COMPLAINT: Abnormal bruising.  INTERVAL HISTORY: Patient is a 46 year old female who over the past month has developed multiple GI complaints including bright red blood per rectum.  She has a colonoscopy scheduled for this coming Friday.  She also has increased bruising over the same timeframe as well as intermittent dizziness and joint pain.  She otherwise feels well.  She has no other neurologic complaints.  She denies any weight loss.  She has no chest pain, cough, hemoptysis, or shortness of breath.  She denies any abdominal pain.  She has no  urologic complaints.  Patient otherwise feels well and offers no further specific complaints.  REVIEW OF SYSTEMS:   Review of Systems  Constitutional: Positive for malaise/fatigue. Negative for fever and weight loss.  Respiratory: Negative.  Negative for cough, hemoptysis and shortness of breath.   Cardiovascular: Negative.  Negative for chest pain and leg swelling.  Gastrointestinal: Positive for blood in stool, diarrhea and nausea. Negative for abdominal pain, constipation, heartburn, melena and vomiting.  Genitourinary: Negative.   Musculoskeletal: Positive for joint pain.  Skin: Negative.  Negative for rash.  Neurological: Positive for weakness.  Psychiatric/Behavioral: Negative.  The patient is not nervous/anxious.     As per HPI. Otherwise, a complete review of systems is negative.  PAST MEDICAL HISTORY: Past Medical History:  Diagnosis Date  . Asthma   . Migraines    TAKES TYLOX PRN    PAST SURGICAL HISTORY: Past Surgical History:  Procedure Laterality Date  . ANKLE  SURGERY Right 2003   Reconstruct Ligaments  . DILATION AND CURETTAGE OF UTERUS  1996  . HAND SURGERY  1993  . INTRAUTERINE DEVICE INSERTION  04/02/2014   [place], 2001   X 2 Removal of Cysts  . Thumb surg      FAMILY HISTORY: Family History  Problem Relation Age of Onset  . Hypertension Father   . Hemachromatosis Father   . Depression Mother   . Anxiety disorder Mother   . Thyroid cancer Mother   . Bipolar disorder Sister   . Mental illness Sister   . Cervical cancer Sister   . Skin cancer Sister   . Bipolar disorder Maternal Aunt   . Cancer Maternal Aunt        BRAIN  . Other Maternal Grandmother        ANXIETY  . Asthma Maternal Grandmother   . Cancer Paternal Grandmother        LIVER  . Prostate cancer Paternal Grandfather   . Skin cancer Paternal Grandfather     ADVANCED DIRECTIVES (Y/N):  N  HEALTH MAINTENANCE: Social History   Tobacco Use  . Smoking status: Never Smoker  . Smokeless tobacco: Never Used  Substance Use Topics  . Alcohol use: No    Alcohol/week: 0.0 oz  . Drug use: No     Colonoscopy:  PAP:  Bone density:  Lipid panel:  Allergies  Allergen Reactions  . Augmentin [Amoxicillin-Pot Clavulanate] Anaphylaxis and Swelling    Current Outpatient Medications  Medication Sig Dispense Refill  . ALPRAZolam (XANAX) 0.5 MG tablet Take 1 tablet (0.5 mg total) by mouth 2 (two) times daily as needed  for anxiety. 180 tablet 5  . Cetirizine HCl (ZYRTEC ALLERGY) 10 MG CAPS Take by mouth.    . Cholecalciferol (VITAMIN D PO) Take 2,000 mg by mouth daily.     . fluticasone (FLONASE) 50 MCG/ACT nasal spray Place 2 sprays into both nostrils daily. 16 g 4  . ketorolac (TORADOL) 10 MG tablet Take 1-2 tablets at the onset of headache. No more than 4 tablets in a day 20 tablet 1  . Multiple Vitamin (MULTIVITAMIN) tablet Take 1 tablet daily by mouth.    . venlafaxine XR (EFFEXOR XR) 75 MG 24 hr capsule Take 1 capsule (75 mg total) by mouth  daily with breakfast. 90 capsule 4  . zolpidem (AMBIEN) 10 MG tablet Take 1 tablet (10 mg total) by mouth at bedtime. 90 tablet 1  . albuterol (PROAIR HFA) 108 (90 BASE) MCG/ACT inhaler Inhale 1-2 puffs into the lungs every 6 (six) hours as needed for wheezing or shortness of breath.    . cloNIDine (CATAPRES) 0.1 MG tablet TAKE 1-2 TABLETS BY MOUTH  THREE TIMES A DAY IF NEEDED (Patient not taking: Reported on 05/27/2017) 180 tablet 0   Current Facility-Administered Medications  Medication Dose Route Frequency Provider Last Rate Last Dose  . levonorgestrel (MIRENA) 20 MCG/24HR IUD   Intrauterine Once Terrance Mass, MD        OBJECTIVE: Vitals:   05/27/17 1028  BP: 134/81  Pulse: 84  Temp: 98.4 F (36.9 C)     Body mass index is 27.17 kg/m.    ECOG FS:0 - Asymptomatic  General: Well-developed, well-nourished, no acute distress. Eyes: Pink conjunctiva, anicteric sclera. HEENT: Normocephalic, moist mucous membranes, clear oropharnyx. Lungs: Clear to auscultation bilaterally. Heart: Regular rate and rhythm. No rubs, murmurs, or gallops. Abdomen: Soft, nontender, nondistended. No organomegaly noted, normoactive bowel sounds. Musculoskeletal: No edema, cyanosis, or clubbing. Neuro: Alert, answering all questions appropriately. Cranial nerves grossly intact. Skin: No rashes or petechiae noted. Psych: Normal affect. Lymphatics: No cervical, calvicular, axillary or inguinal LAD.   LAB RESULTS:  Lab Results  Component Value Date   NA 138 05/09/2017   K 3.9 05/09/2017   CL 106 05/09/2017   CO2 27 05/09/2017   GLUCOSE 93 05/09/2017   BUN 11 05/09/2017   CREATININE 0.72 05/09/2017   CALCIUM 8.8 (L) 05/09/2017   PROT 7.2 05/09/2017   ALBUMIN 4.3 05/09/2017   AST 19 05/09/2017   ALT 12 (L) 05/09/2017   ALKPHOS 59 05/09/2017   BILITOT 0.7 05/09/2017   GFRNONAA >60 05/09/2017   GFRAA >60 05/09/2017    Lab Results  Component Value Date   WBC 6.0 05/09/2017   NEUTROABS 4.1  05/09/2017   HGB 13.3 05/09/2017   HCT 38.9 05/09/2017   MCV 90.9 05/09/2017   PLT 227 05/09/2017     STUDIES: No results found.  ASSESSMENT: Abnormal bruising.  PLAN:    1. Abnormal bruising: Unclear etiology.  Patient recently had a normal platelet count.  Will get von Willebrand's panel, PT, PTT, and platelet function assay.  Patient gets all of her laboratory work at [facility] and is currently pending at time of dictation.  Return to clinic in 2 weeks to discuss the results and additional diagnostic planning if necessary. 2.  Family history of hemochromatosis: Will get iron stores as well as hemochromatosis gene mutation today. 3.  Joint pain: ANA is pending at time of dictation. 4.  Bright red blood per rectum: Patient has a colonoscopy scheduled for this Friday for  further evaluation. 5.  Dizziness: Unclear etiology, monitor.  Approximately 60 minutes was spent in discussion of which greater than 50% was consultation.  Patient expressed understanding and was in agreement with this plan. She also understands that She can call clinic at any time with any questions, concerns, or complaints.   Cancer Staging No matching staging information was found for the patient.  Lloyd Huger, MD   05/27/2017 12:34 PM

## 2017-05-23 ENCOUNTER — Encounter (INDEPENDENT_AMBULATORY_CARE_PROVIDER_SITE_OTHER): Payer: Self-pay

## 2017-05-23 ENCOUNTER — Ambulatory Visit (INDEPENDENT_AMBULATORY_CARE_PROVIDER_SITE_OTHER): Payer: 59 | Admitting: Gastroenterology

## 2017-05-23 ENCOUNTER — Other Ambulatory Visit: Payer: Self-pay

## 2017-05-23 ENCOUNTER — Encounter: Payer: Self-pay | Admitting: Gastroenterology

## 2017-05-23 VITALS — BP 135/90 | HR 73 | Temp 98.6°F | Ht 64.0 in | Wt 157.4 lb

## 2017-05-23 DIAGNOSIS — K625 Hemorrhage of anus and rectum: Secondary | ICD-10-CM

## 2017-05-23 DIAGNOSIS — R634 Abnormal weight loss: Secondary | ICD-10-CM

## 2017-05-23 NOTE — Progress Notes (Signed)
Colleen Antigua, MD [address] [place], [place], [place], [address] [phone], [place], [place], [place], [address] Phone: [phone]  Fax: [phone]  Consultation  Referring Provider:     Birdie Sons, MD Primary Care Physician:  Birdie Sons, MD Primary Gastroenterologist:  Dr. Bonna Gains         Reason for Referral:   BRBPR    Date of Admission:  (Not on file) Date of Consultation:  05/23/2017         HPI:   Colleen Sloan is a 46 y.o. female presents with BRBPR that started a year ago, occurs intermittently not associated with constipation. Reports associated weight loss of 10 lb over the past 6-12 months. No abdominal pain, N/V, dysphagia. Reports 1 lose BM daily morning and reports blood in stool despite no constipation. No family hx of colon cancer but think sister may be undergoing eval for IBD and grandmother may have had it.  No NSAID use.  Past Medical History:  Diagnosis Date  . Asthma   . Migraines    TAKES TYLOX PRN    Past Surgical History:  Procedure Laterality Date  . ANKLE SURGERY Right 2003   Reconstruct Ligaments  . DILATION AND CURETTAGE OF UTERUS  1996  . HAND SURGERY  1993  . INTRAUTERINE DEVICE INSERTION  04/02/2014   [place], 2001   X 2 Removal of Cysts  . Thumb surg      Prior to Admission medications   Medication Sig Start Date End Date Taking? Authorizing Provider  albuterol (PROAIR HFA) 108 (90 BASE) MCG/ACT inhaler Inhale 1-2 puffs into the lungs every 6 (six) hours as needed for wheezing or shortness of breath.   Yes [provider]  ALPRAZolam (XANAX) 0.5 MG tablet Take 1 tablet (0.5 mg total) by mouth 2 (two) times daily as needed for anxiety. 04/01/17  Yes Birdie Sons, MD  B Complex Vitamins (VITAMIN B COMPLEX 100 IJ) vitamin B complex   Yes [provider]  butorphanol (STADOL) 10 MG/ML nasal spray Place into the nose.   Yes [provider]    Cetirizine HCl (ZYRTEC ALLERGY) 10 MG CAPS Take by mouth.   Yes [provider]  chlorpheniramine-HYDROcodone (Dulac) 10-8 MG/5ML SUER Take by mouth. 03/03/12  Yes [provider]  Cholecalciferol (VITAMIN D PO) Take 2,000 mg by mouth daily.    Yes [provider]  cloNIDine (CATAPRES) 0.1 MG tablet TAKE 1-2 TABLETS BY MOUTH  THREE TIMES A DAY IF NEEDED 12/23/16  Yes Birdie Sons, MD  escitalopram (LEXAPRO) 20 MG tablet Take by mouth.   Yes [provider]  fluticasone (FLONASE) 50 MCG/ACT nasal spray Place 2 sprays into both nostrils daily. 05/21/17  Yes Birdie Sons, MD  ketorolac (TORADOL) 10 MG tablet Take 1-2 tablets at the onset of headache. No more than 4 tablets in a day 02/09/16  Yes Birdie Sons, MD  venlafaxine XR (EFFEXOR XR) 75 MG 24 hr capsule Take 1 capsule (75 mg total) by mouth daily with breakfast. 06/25/16  Yes Birdie Sons, MD  zolpidem (AMBIEN) 10 MG tablet Take 1 tablet (10 mg total) by mouth at bedtime. 04/01/17  Yes Birdie Sons, MD    Family History  Problem Relation Age of Onset  . Hypertension Father   . Hemachromatosis Father   . Depression Mother   . Anxiety disorder Mother   . Bipolar disorder Sister   .  Mental illness Sister   . Bipolar disorder Maternal Aunt   . Cancer Maternal Aunt        BRAIN  . Other Maternal Grandmother        ANXIETY  . Asthma Maternal Grandmother   . Cancer Paternal Grandmother        LIVER     Social History  Substance Use Topics  . Smoking status: Never Smoker  . Smokeless tobacco: Never Used  . Alcohol use No    Allergies as of 05/23/2017 - Review Complete 05/23/2017  Allergen Reaction Noted  . Augmentin [amoxicillin-pot clavulanate] Anaphylaxis and Swelling 09/14/2011    Review of Systems:    All systems reviewed and negative except where noted in HPI.   Physical Exam:  Vital signs in last 24 hours: @VSRANGES @   General:   Pleasant, cooperative in  NAD Head:  Normocephalic and atraumatic. Eyes:   No icterus.   Conjunctiva pink. PERRLA. Ears:  Normal auditory acuity. Neck:  Supple; no masses or thyroidomegaly Lungs: Respirations even and unlabored. Lungs clear to auscultation bilaterally.   No wheezes, crackles, or rhonchi.  Heart:  Regular rate and rhythm;  Without murmur, clicks, rubs or gallops Abdomen:  Soft, nondistended, nontender. Normal bowel sounds. No appreciable masses or hepatomegaly.  No rebound or guarding.  Rectal:  No external hemorrhoids, soft brown stool in rectal vault, no blood or melena on DRE Msk:  Symmetrical without gross deformities.  Strength 5/5 UE and LE  Extremities:  Without edema, cyanosis or clubbing. Neurologic:  Alert and oriented x3;  grossly normal neurologically. Skin:  Intact without significant lesions or rashes. Cervical Nodes:  No significant cervical adenopathy. Psych:  Alert and cooperative. Normal affect.  LAB RESULTS: No results for input(s): WBC, HGB, HCT, PLT in the last 72 hours. BMET No results for input(s): NA, K, CL, CO2, GLUCOSE, BUN, CREATININE, CALCIUM in the last 72 hours. LFT No results for input(s): PROT, ALBUMIN, AST, ALT, ALKPHOS, BILITOT, BILIDIR, IBILI in the last 72 hours. PT/INR No results for input(s): LABPROT, INR in the last 72 hours.  Labs from recent ER visit reviewed  STUDIES: No results found.    Impression / Plan:   Colleen Sloan is a 46 y.o. y/o female with BRBPR for 1 yr with weight loss and possible family hx of IBD here for initial evaluation  Patient does not have any anemia but weight loss and continuing anemia needs further eval.  Differential includes internal hemorrhoids, IBD, malignancy Pt informed that procedure may be normal given her normal CBC and inflammatory markers, with risks and benefits of colonoscopy explained in detail but patient would like to proceed with colonoscopy as she has had symptoms for 1 yr.  Continue to  maintain soft stools and high fiber diet Avoid NSAIDs   Thank you for involving me in the care of this patient.     Virgel Manifold, MD  05/23/2017, 3:28 PM

## 2017-05-27 ENCOUNTER — Encounter: Payer: Self-pay | Admitting: Oncology

## 2017-05-27 ENCOUNTER — Inpatient Hospital Stay: Payer: 59 | Attending: Oncology | Admitting: Oncology

## 2017-05-27 DIAGNOSIS — R531 Weakness: Secondary | ICD-10-CM | POA: Insufficient documentation

## 2017-05-27 DIAGNOSIS — R42 Dizziness and giddiness: Secondary | ICD-10-CM | POA: Insufficient documentation

## 2017-05-27 DIAGNOSIS — Z79899 Other long term (current) drug therapy: Secondary | ICD-10-CM | POA: Insufficient documentation

## 2017-05-27 DIAGNOSIS — M1990 Unspecified osteoarthritis, unspecified site: Secondary | ICD-10-CM | POA: Insufficient documentation

## 2017-05-27 DIAGNOSIS — R5381 Other malaise: Secondary | ICD-10-CM | POA: Insufficient documentation

## 2017-05-27 DIAGNOSIS — Z8049 Family history of malignant neoplasm of other genital organs: Secondary | ICD-10-CM | POA: Insufficient documentation

## 2017-05-27 DIAGNOSIS — Z8042 Family history of malignant neoplasm of prostate: Secondary | ICD-10-CM | POA: Insufficient documentation

## 2017-05-27 DIAGNOSIS — Z808 Family history of malignant neoplasm of other organs or systems: Secondary | ICD-10-CM | POA: Diagnosis not present

## 2017-05-27 DIAGNOSIS — K625 Hemorrhage of anus and rectum: Secondary | ICD-10-CM | POA: Insufficient documentation

## 2017-05-27 DIAGNOSIS — R5383 Other fatigue: Secondary | ICD-10-CM | POA: Diagnosis not present

## 2017-05-27 DIAGNOSIS — R238 Other skin changes: Secondary | ICD-10-CM | POA: Insufficient documentation

## 2017-05-27 DIAGNOSIS — R233 Spontaneous ecchymoses: Secondary | ICD-10-CM

## 2017-05-27 DIAGNOSIS — Z880 Allergy status to penicillin: Secondary | ICD-10-CM | POA: Insufficient documentation

## 2017-05-27 NOTE — Progress Notes (Signed)
Patient is here for follow up, she mentions she has no appetite and major dizzy spells. She mentions she has had diarrhea the past 30 days and is going to have a colonoscopy Friday.

## 2017-05-28 ENCOUNTER — Other Ambulatory Visit: Payer: Self-pay

## 2017-05-28 DIAGNOSIS — K625 Hemorrhage of anus and rectum: Secondary | ICD-10-CM

## 2017-05-29 ENCOUNTER — Encounter: Payer: Self-pay | Admitting: Oncology

## 2017-05-30 ENCOUNTER — Ambulatory Visit
Admission: RE | Admit: 2017-05-30 | Discharge: 2017-05-30 | Disposition: A | Discharge status: Home / Self Care | Payer: 59 | Source: Ambulatory Visit | Attending: Family Medicine | Admitting: Family Medicine

## 2017-05-30 DIAGNOSIS — Z1231 Encounter for screening mammogram for malignant neoplasm of breast: Secondary | ICD-10-CM | POA: Insufficient documentation

## 2017-05-30 NOTE — Discharge Instructions (Signed)
General Anesthesia, Adult, Care After °These instructions provide you with information about caring for yourself after your procedure. Your health care provider may also give you more specific instructions. Your treatment has been planned according to current medical practices, but problems sometimes occur. Call your health care provider if you have any problems or questions after your procedure. °What can I expect after the procedure? °After the procedure, it is common to have: °· Vomiting. °· A sore throat. °· Mental slowness. ° °It is common to feel: °· Nauseous. °· Cold or shivery. °· Sleepy. °· Tired. °· Sore or achy, even in parts of your body where you did not have surgery. ° °Follow these instructions at home: °For at least 24 hours after the procedure: °· Do not: °? Participate in activities where you could fall or become injured. °? Drive. °? Use heavy machinery. °? Drink alcohol. °? Take sleeping pills or medicines that cause drowsiness. °? Make important decisions or sign legal documents. °? Take care of children on your own. °· Rest. °Eating and drinking °· If you vomit, drink water, juice, or soup when you can drink without vomiting. °· Drink enough fluid to keep your urine clear or pale yellow. °· Make sure you have little or no nausea before eating solid foods. °· Follow the diet recommended by your health care provider. °General instructions °· Have a responsible adult stay with you until you are awake and alert. °· Return to your normal activities as told by your health care provider. Ask your health care provider what activities are safe for you. °· Take over-the-counter and prescription medicines only as told by your health care provider. °· If you smoke, do not smoke without supervision. °· Keep all follow-up visits as told by your health care provider. This is important. °Contact a health care provider if: °· You continue to have nausea or vomiting at home, and medicines are not helpful. °· You  cannot drink fluids or start eating again. °· You cannot urinate after 8-12 hours. °· You develop a skin rash. °· You have fever. °· You have increasing redness at the site of your procedure. °Get help right away if: °· You have difficulty breathing. °· You have chest pain. °· You have unexpected bleeding. °· You feel that you are having a life-threatening or urgent problem. °This information is not intended to replace advice given to you by your health care provider. Make sure you discuss any questions you have with your health care provider. °Document Released: 10/15/2000 Document Revised: 12/12/2015 Document Reviewed: 06/23/2015 °Elsevier Interactive Patient Education © 2018 [employer] ° °

## 2017-05-31 ENCOUNTER — Ambulatory Visit: Admit: 2017-05-31 | Payer: 59 | Admitting: Gastroenterology

## 2017-05-31 ENCOUNTER — Encounter
Admission: RE | Disposition: A | Discharge status: Home / Self Care | Payer: Self-pay | Source: Ambulatory Visit | Attending: Gastroenterology

## 2017-05-31 ENCOUNTER — Ambulatory Visit: Payer: 59 | Admitting: Anesthesiology

## 2017-05-31 ENCOUNTER — Ambulatory Visit
Admission: RE | Admit: 2017-05-31 | Discharge: 2017-05-31 | Disposition: A | Discharge status: Home / Self Care | Payer: 59 | Source: Ambulatory Visit | Attending: Gastroenterology | Admitting: Gastroenterology

## 2017-05-31 DIAGNOSIS — J45909 Unspecified asthma, uncomplicated: Secondary | ICD-10-CM | POA: Insufficient documentation

## 2017-05-31 DIAGNOSIS — F419 Anxiety disorder, unspecified: Secondary | ICD-10-CM | POA: Diagnosis not present

## 2017-05-31 DIAGNOSIS — D122 Benign neoplasm of ascending colon: Secondary | ICD-10-CM | POA: Diagnosis not present

## 2017-05-31 DIAGNOSIS — K921 Melena: Secondary | ICD-10-CM | POA: Diagnosis not present

## 2017-05-31 DIAGNOSIS — D123 Benign neoplasm of transverse colon: Secondary | ICD-10-CM

## 2017-05-31 DIAGNOSIS — K640 First degree hemorrhoids: Secondary | ICD-10-CM | POA: Diagnosis not present

## 2017-05-31 DIAGNOSIS — Z79899 Other long term (current) drug therapy: Secondary | ICD-10-CM | POA: Diagnosis not present

## 2017-05-31 HISTORY — DX: Unspecified osteoarthritis, unspecified site: M19.90

## 2017-05-31 HISTORY — PX: COLONOSCOPY WITH PROPOFOL: SHX5780

## 2017-05-31 HISTORY — PX: POLYPECTOMY: SHX5525

## 2017-05-31 SURGERY — COLONOSCOPY WITH PROPOFOL
Anesthesia: Choice

## 2017-05-31 SURGERY — COLONOSCOPY WITH PROPOFOL
Anesthesia: General | Wound class: Contaminated

## 2017-05-31 MED ORDER — PROPOFOL 10 MG/ML IV BOLUS
INTRAVENOUS | Status: DC | PRN
  Administered 2017-05-31: 20 mg via INTRAVENOUS
  Administered 2017-05-31 (×2): 30 mg via INTRAVENOUS
  Administered 2017-05-31: 20 mg via INTRAVENOUS
  Administered 2017-05-31: 80 mg via INTRAVENOUS
  Administered 2017-05-31 (×2): 30 mg via INTRAVENOUS
  Administered 2017-05-31: 20 mg via INTRAVENOUS

## 2017-05-31 MED ORDER — OXYCODONE HCL 5 MG/5ML PO SOLN: 5.0000 mg | Freq: Once | ORAL | Status: DC | PRN

## 2017-05-31 MED ORDER — LACTATED RINGERS IV SOLN: INTRAVENOUS | Status: DC

## 2017-05-31 MED ORDER — STERILE WATER FOR IRRIGATION IR SOLN
Status: DC | PRN
  Administered 2017-05-31: 13:00:00

## 2017-05-31 MED ORDER — OXYCODONE HCL 5 MG PO TABS: 5.0000 mg | ORAL_TABLET | Freq: Once | ORAL | Status: DC | PRN

## 2017-05-31 MED ORDER — LIDOCAINE HCL (CARDIAC) 20 MG/ML IV SOLN
INTRAVENOUS | Status: DC | PRN
  Administered 2017-05-31: 50 mg via INTRAVENOUS

## 2017-05-31 MED ORDER — LACTATED RINGERS IV SOLN
INTRAVENOUS | Status: DC | PRN
  Administered 2017-05-31: 12:00:00 via INTRAVENOUS

## 2017-05-31 SURGICAL SUPPLY — 23 items
CANISTER SUCT 1200ML W/VALVE (MISCELLANEOUS) ×4 IMPLANT
CLIP HMST 235XBRD CATH ROT (MISCELLANEOUS) IMPLANT
CLIP RESOLUTION 360 11X235 (MISCELLANEOUS)
FCP ESCP3.2XJMB 240X2.8X (MISCELLANEOUS)
FORCEPS BIOP RAD 4 LRG CAP 4 (CUTTING FORCEPS) IMPLANT
FORCEPS BIOP RJ4 240 W/NDL (MISCELLANEOUS)
FORCEPS ESCP3.2XJMB 240X2.8X (MISCELLANEOUS) IMPLANT
GOWN CVR UNV OPN BCK APRN NK (MISCELLANEOUS) ×4 IMPLANT
GOWN ISOL THUMB LOOP REG UNIV (MISCELLANEOUS) ×4
INJECTOR VARIJECT VIN23 (MISCELLANEOUS) IMPLANT
KIT DEFENDO VALVE AND CONN (KITS) IMPLANT
KIT ENDO PROCEDURE OLY (KITS) ×4 IMPLANT
MARKER SPOT ENDO TATTOO 5ML (MISCELLANEOUS) IMPLANT
PAD GROUND ADULT SPLIT (MISCELLANEOUS) IMPLANT
PROBE APC STR FIRE (PROBE) IMPLANT
RETRIEVER NET ROTH 2.5X230 LF (MISCELLANEOUS) IMPLANT
SNARE SHORT THROW 13M SML OVAL (MISCELLANEOUS) ×4 IMPLANT
SNARE SHORT THROW 30M LRG OVAL (MISCELLANEOUS) IMPLANT
SNARE SNG USE RND 15MM (INSTRUMENTS) IMPLANT
SPOT EX ENDOSCOPIC TATTOO (MISCELLANEOUS)
TRAP ETRAP POLY (MISCELLANEOUS) IMPLANT
VARIJECT INJECTOR VIN23 (MISCELLANEOUS)
WATER STERILE IRR 250ML POUR (IV SOLUTION) ×4 IMPLANT

## 2017-05-31 NOTE — Anesthesia Procedure Notes (Signed)
Performed by: Nikola Marone, CRNA Pre-anesthesia Checklist: Patient identified, Emergency Drugs available, Suction available, Timeout performed and Patient being monitored Patient Re-evaluated:Patient Re-evaluated prior to induction Oxygen Delivery Method: Nasal cannula Placement Confirmation: positive ETCO2       

## 2017-05-31 NOTE — H&P (Signed)
Lucilla Lame, MD [address] [place], [place] [address] Phone:[phone] Fax : [phone]  Primary Care Physician:  Birdie Sons, MD Primary Gastroenterologist:  Dr. Allen Norris  Pre-Procedure History & Physical: HPI:  Colleen Sloan is a 46 y.o. female is here for an colonoscopy.   Past Medical History:  Diagnosis Date  . Arthritis    throughout body  . Asthma   . Migraines    TAKES TYLOX PRN    Past Surgical History:  Procedure Laterality Date  . ANKLE SURGERY Right 2003   Reconstruct Ligaments  . DILATION AND CURETTAGE OF UTERUS  1996  . HAND SURGERY  1993  . INTRAUTERINE DEVICE INSERTION  04/02/2014   [place], 2001   X 2 Removal of Cysts  . Thumb surg      Prior to Admission medications   Medication Sig Start Date End Date Taking? Authorizing Provider  ALPRAZolam Duanne Moron) 0.5 MG tablet Take 1 tablet (0.5 mg total) by mouth 2 (two) times daily as needed for anxiety. 04/01/17  Yes Birdie Sons, MD  Cholecalciferol (VITAMIN D PO) Take 2,000 mg by mouth daily.    Yes [provider]  fluticasone (FLONASE) 50 MCG/ACT nasal spray Place 2 sprays into both nostrils daily. 05/21/17  Yes Birdie Sons, MD  ketorolac (TORADOL) 10 MG tablet Take 1-2 tablets at the onset of headache. No more than 4 tablets in a day 02/09/16  Yes FisherKirstie Peri, MD  Multiple Vitamin (MULTIVITAMIN) tablet Take 1 tablet daily by mouth.   Yes [provider]  venlafaxine XR (EFFEXOR XR) 75 MG 24 hr capsule Take 1 capsule (75 mg total) by mouth daily with breakfast. 06/25/16  Yes Birdie Sons, MD  zolpidem (AMBIEN) 10 MG tablet Take 1 tablet (10 mg total) by mouth at bedtime. 04/01/17  Yes Birdie Sons, MD  albuterol (PROAIR HFA) 108 (90 BASE) MCG/ACT inhaler Inhale 1-2 puffs into the lungs every 6 (six) hours as needed for wheezing or shortness of breath.    [provider]  Cetirizine HCl (ZYRTEC ALLERGY) 10 MG  CAPS Take by mouth.    [provider]  cloNIDine (CATAPRES) 0.1 MG tablet TAKE 1-2 TABLETS BY MOUTH  THREE TIMES A DAY IF NEEDED Patient not taking: Reported on 05/27/2017 12/23/16   Birdie Sons, MD    Allergies as of 05/28/2017 - Review Complete 05/28/2017  Allergen Reaction Noted  . Augmentin [amoxicillin-pot clavulanate] Anaphylaxis and Swelling 09/14/2011    Family History  Problem Relation Age of Onset  . Hypertension Father   . Hemachromatosis Father   . Depression Mother   . Anxiety disorder Mother   . Thyroid cancer Mother   . Bipolar disorder Sister   . Mental illness Sister   . Cervical cancer Sister   . Skin cancer Sister   . Bipolar disorder Maternal Aunt   . Cancer Maternal Aunt        BRAIN  . Other Maternal Grandmother        ANXIETY  . Asthma Maternal Grandmother   . Cancer Paternal Grandmother        LIVER  . Prostate cancer Paternal Grandfather   . Skin cancer Paternal Grandfather   . Breast cancer Neg Hx     Social History   Socioeconomic History  . Marital status: Married    Spouse name: Not on file  . Number of children: Not on file  .  Years of education: Not on file  . Highest education level: Not on file  Social Needs  . Financial resource strain: Not on file  . Food insecurity - worry: Not on file  . Food insecurity - inability: Not on file  . Transportation needs - medical: Not on file  . Transportation needs - non-medical: Not on file  Occupational History  . Not on file  Tobacco Use  . Smoking status: Never Smoker  . Smokeless tobacco: Never Used  Substance and Sexual Activity  . Alcohol use: No    Alcohol/week: 0.0 oz  . Drug use: No  . Sexual activity: Yes    Birth control/protection: IUD    Comment: Mirena 04/02/2014-1st intercourse 46 yo.--Fewer than 5 partners  Other Topics Concern  . Not on file  Social History Narrative  . Not on file    Review of Systems: See HPI, otherwise negative ROS  Physical  Exam: BP 135/80   Pulse 62   Temp 97.9 F (36.6 C) (Temporal)   Ht 5\' 4"  (1.626 m)   Wt 153 lb (69.4 kg)   SpO2 100%   BMI 26.26 kg/m  General:   Alert,  pleasant and cooperative in NAD Head:  Normocephalic and atraumatic. Neck:  Supple; no masses or thyromegaly. Lungs:  Clear throughout to auscultation.    Heart:  Regular rate and rhythm. Abdomen:  Soft, nontender and nondistended. Normal bowel sounds, without guarding, and without rebound.   Neurologic:  Alert and  oriented x4;  grossly normal neurologically.  Impression/Plan: Colleen Sloan is here for an colonoscopy to be performed for hematochezia  Risks, benefits, limitations, and alternatives regarding  colonoscopy have been reviewed with the patient.  Questions have been answered.  All parties agreeable.   Lucilla Lame, MD  05/31/2017, 11:54 AM

## 2017-05-31 NOTE — Op Note (Signed)
[facility] Gastroenterology Patient Name: Colleen Sloan Procedure Date: 05/31/2017 12:20 PM MRN: [id number] Account #: [id number] Date of Birth: [birth year] Admit Type: Outpatient Age: 46 Room: [facility] OR ROOM 01 Gender: Female Note Status: Finalized Procedure:            Colonoscopy Indications:          Hematochezia Providers:            Lucilla Lame MD, MD Referring MD:         Kirstie Peri. Caryn Section, MD (Referring MD) Medicines:            Propofol per Anesthesia Complications:        No immediate complications. Procedure:            Pre-Anesthesia Assessment:                       - Prior to the procedure, a History and Physical was                        performed, and patient medications and allergies were                        reviewed. The patient's tolerance of previous                        anesthesia was also reviewed. The risks and benefits of                        the procedure and the sedation options and risks were                        discussed with the patient. All questions were                        answered, and informed consent was obtained. Prior                        Anticoagulants: The patient has taken no previous                        anticoagulant or antiplatelet agents. ASA Grade                        Assessment: II - A patient with mild systemic disease.                        After reviewing the risks and benefits, the patient was                        deemed in satisfactory condition to undergo the                        procedure.                       After obtaining informed consent, the colonoscope was                        passed under direct vision. Throughout the procedure,  the patient's blood pressure, pulse, and oxygen                        saturations were monitored continuously. The Olympus                        Colonoscope 190 [id number]) was introduced through the   anus and advanced to the the cecum, identified by                        appendiceal orifice and ileocecal valve. The                        colonoscopy was performed without difficulty. The                        patient tolerated the procedure well. The quality of                        the bowel preparation was excellent. Findings:      The perianal and digital rectal examinations were normal.      A 2 mm polyp was found in the ascending colon. The polyp was sessile.       The polyp was removed with a cold biopsy forceps. Resection and       retrieval were complete.      A 2 mm polyp was found in the transverse colon. The polyp was sessile.       The polyp was removed with a cold biopsy forceps. Resection and       retrieval were complete.      Non-bleeding internal hemorrhoids were found during retroflexion. The       hemorrhoids were Grade I (internal hemorrhoids that do not prolapse). Impression:           - One 2 mm polyp in the ascending colon, removed with a                        cold biopsy forceps. Resected and retrieved.                       - One 2 mm polyp in the transverse colon, removed with                        a cold biopsy forceps. Resected and retrieved.                       - Non-bleeding internal hemorrhoids. Recommendation:       - Discharge patient to home.                       - Resume previous diet.                       - Continue present medications.                       - Await pathology results.                       - Repeat colonoscopy in 5 years if polyp adenoma and 10  years if hyperplastic Procedure Code(s):    --- Professional ---                       [id number], Colonoscopy, flexible; with biopsy, single or                        multiple Diagnosis Code(s):    --- Professional ---                       K92.1, Melena (includes Hematochezia)                       D12.2, Benign neoplasm of ascending colon                        D12.3, Benign neoplasm of transverse colon (hepatic                        flexure or splenic flexure) CPT copyright 2016 American Medical Association. All rights reserved. The codes documented in this report are preliminary and upon coder review may  be revised to meet current compliance requirements. Lucilla Lame MD, MD 05/31/2017 12:42:24 PM This report has been signed electronically. Number of Addenda: 0 Note Initiated On: 05/31/2017 12:20 PM Scope Withdrawal Time: 0 hours 6 minutes 47 seconds  Total Procedure Duration: 0 hours 9 minutes 30 seconds       [place]

## 2017-05-31 NOTE — Transfer of Care (Signed)
Immediate Anesthesia Transfer of Care Note  Patient: Colleen Sloan  Procedure(s) Performed: COLONOSCOPY WITH PROPOFOL (N/A )  Patient Location: PACU  Anesthesia Type: General  Level of Consciousness: awake, alert  and patient cooperative  Airway and Oxygen Therapy: Patient Spontanous Breathing and Patient connected to supplemental oxygen  Post-op Assessment: Post-op Vital signs reviewed, Patient's Cardiovascular Status Stable, Respiratory Function Stable, Patent Airway and No signs of Nausea or vomiting  Post-op Vital Signs: Reviewed and stable  Complications: No apparent anesthesia complications

## 2017-05-31 NOTE — Anesthesia Postprocedure Evaluation (Signed)
Anesthesia Post Note  Patient: Colleen Sloan  Procedure(s) Performed: COLONOSCOPY WITH PROPOFOL (N/A ) POLYPECTOMY  Patient location during evaluation: PACU Anesthesia Type: General Level of consciousness: awake and alert Pain management: pain level controlled Vital Signs Assessment: post-procedure vital signs reviewed and stable Respiratory status: spontaneous breathing, nonlabored ventilation, respiratory function stable and patient connected to nasal cannula oxygen Cardiovascular status: blood pressure returned to baseline and stable Postop Assessment: no apparent nausea or vomiting Anesthetic complications: no    [address]

## 2017-05-31 NOTE — Anesthesia Preprocedure Evaluation (Signed)
Anesthesia Evaluation  Patient identified by MRN, date of birth, ID band  Reviewed: NPO status   History of Anesthesia Complications Negative for: history of anesthetic complications  Airway Mallampati: II  TM Distance: >3 FB Neck ROM: full    Dental no notable dental hx.    Pulmonary asthma ,    Pulmonary exam normal        Cardiovascular Exercise Tolerance: Good negative cardio ROS Normal cardiovascular exam     Neuro/Psych  Headaches, Anxiety    GI/Hepatic negative GI ROS, Neg liver ROS,   Endo/Other  negative endocrine ROS  Renal/GU negative Renal ROS  negative genitourinary   Musculoskeletal  (+) Arthritis ,   Abdominal   Peds  Hematology negative hematology ROS (+)   Anesthesia Other Findings tiva  Reproductive/Obstetrics                             Anesthesia Physical Anesthesia Plan  ASA: II  Anesthesia Plan: General   Post-op Pain Management:    Induction:   PONV Risk Score and Plan:   Airway Management Planned:   Additional Equipment:   Intra-op Plan:   Post-operative Plan:   Informed Consent: I have reviewed the patients History and Physical, chart, labs and discussed the procedure including the risks, benefits and alternatives for the proposed anesthesia with the patient or authorized representative who has indicated his/her understanding and acceptance.     Plan Discussed with: CRNA  Anesthesia Plan Comments:         Anesthesia Quick Evaluation

## 2017-06-03 ENCOUNTER — Encounter: Payer: Self-pay | Admitting: Gastroenterology

## 2017-06-04 ENCOUNTER — Encounter: Payer: Self-pay | Admitting: Gastroenterology

## 2017-06-10 ENCOUNTER — Ambulatory Visit: Payer: 59 | Admitting: Oncology

## 2017-06-16 NOTE — Progress Notes (Signed)
[facility]  [phone] [phone]  ID: Colleen Sloan OB: [birth year]  MR#: [id number]  FTD#:[id number]  Patient Care Team: Birdie Sons, MD as PCP - General (Family Medicine) Phineas Real, Belinda Block, MD as Consulting Physician (Gynecology) Lin Landsman, MD as Consulting Physician (Gastroenterology)  CHIEF COMPLAINT: Abnormal bruising.  INTERVAL HISTORY: Patient returns to clinic today for further evaluation and discussion of her laboratory work.  She has no further GI bleeding, but continues to complain of occasional bruising.  She continues to have occasional dizziness as well.  She otherwise feels well.  She has no other neurologic complaints.  She denies any weight loss.  She has no chest pain, cough, hemoptysis, or shortness of breath.  She denies any abdominal pain.  She has no  urologic complaints.  Patient offers no further specific complaints.  REVIEW OF SYSTEMS:   Review of Systems  Constitutional: Positive for malaise/fatigue. Negative for fever and weight loss.  Respiratory: Negative.  Negative for cough, hemoptysis and shortness of breath.   Cardiovascular: Negative.  Negative for chest pain and leg swelling.  Gastrointestinal: Negative.  Negative for abdominal pain, blood in stool, constipation, diarrhea, heartburn, melena, nausea and vomiting.  Genitourinary: Negative.   Musculoskeletal: Positive for joint pain.  Skin: Negative.  Negative for rash.  Neurological: Positive for dizziness and weakness.  Endo/Heme/Allergies: Bruises/bleeds easily.  Psychiatric/Behavioral: Negative.  The patient is not nervous/anxious.     As per HPI. Otherwise, a complete review of systems is negative.  PAST MEDICAL HISTORY: Past Medical History:  Diagnosis Date  . Arthritis    throughout body  . Asthma   . Migraines    TAKES TYLOX PRN    PAST SURGICAL HISTORY: Past Surgical History:  Procedure Laterality Date  . ANKLE  SURGERY Right 2003   Reconstruct Ligaments  . COLONOSCOPY WITH PROPOFOL N/A 05/31/2017   Procedure: COLONOSCOPY WITH PROPOFOL;  Surgeon: Lucilla Lame, MD;  Location: [facility];  Service: Endoscopy;  Laterality: N/A;  . DILATION AND CURETTAGE OF UTERUS  1996  . HAND SURGERY  1993  . INTRAUTERINE DEVICE INSERTION  04/02/2014   [place], 2001   X 2 Removal of Cysts  . POLYPECTOMY  05/31/2017   Procedure: POLYPECTOMY;  Surgeon: Lucilla Lame, MD;  Location: [facility];  Service: Endoscopy;;  . Thumb surg      FAMILY HISTORY: Family History  Problem Relation Age of Onset  . Hypertension Father   . Hemachromatosis Father   . Depression Mother   . Anxiety disorder Mother   . Thyroid cancer Mother   . Bipolar disorder Sister   . Mental illness Sister   . Cervical cancer Sister   . Skin cancer Sister   . Bipolar disorder Maternal Aunt   . Cancer Maternal Aunt        BRAIN  . Other Maternal Grandmother        ANXIETY  . Asthma Maternal Grandmother   . Cancer Paternal Grandmother        LIVER  . Prostate cancer Paternal Grandfather   . Skin cancer Paternal Grandfather   . Breast cancer Neg Hx     ADVANCED DIRECTIVES (Y/N):  N  HEALTH MAINTENANCE: Social History   Tobacco Use  . Smoking status: Never Smoker  . Smokeless tobacco: Never Used  Substance Use Topics  . Alcohol use: No    Alcohol/week: 0.0 oz  . Drug use: No  Colonoscopy:  PAP:  Bone density:  Lipid panel:  Allergies  Allergen Reactions  . Augmentin [Amoxicillin-Pot Clavulanate] Anaphylaxis and Swelling    Current Outpatient Medications  Medication Sig Dispense Refill  . albuterol (PROAIR HFA) 108 (90 BASE) MCG/ACT inhaler Inhale 1-2 puffs into the lungs every 6 (six) hours as needed for wheezing or shortness of breath.    . ALPRAZolam (XANAX) 0.5 MG tablet Take 1 tablet (0.5 mg total) by mouth 2 (two) times daily as needed for anxiety. 180 tablet 5  .  Cetirizine HCl (ZYRTEC ALLERGY) 10 MG CAPS Take by mouth.    . Cholecalciferol (VITAMIN D PO) Take 2,000 mg by mouth daily.     . cloNIDine (CATAPRES) 0.1 MG tablet TAKE 1-2 TABLETS BY MOUTH  THREE TIMES A DAY IF NEEDED 180 tablet 0  . fluticasone (FLONASE) 50 MCG/ACT nasal spray Place 2 sprays into both nostrils daily. 16 g 4  . ketorolac (TORADOL) 10 MG tablet Take 1-2 tablets at the onset of headache. No more than 4 tablets in a day 20 tablet 1  . Multiple Vitamin (MULTIVITAMIN) tablet Take 1 tablet daily by mouth.    . venlafaxine XR (EFFEXOR XR) 75 MG 24 hr capsule Take 1 capsule (75 mg total) by mouth daily with breakfast. 90 capsule 4  . zolpidem (AMBIEN) 10 MG tablet Take 1 tablet (10 mg total) by mouth at bedtime. 90 tablet 1   Current Facility-Administered Medications  Medication Dose Route Frequency Provider Last Rate Last Dose  . levonorgestrel (MIRENA) 20 MCG/24HR IUD   Intrauterine Once Terrance Mass, MD        OBJECTIVE: Vitals:   06/17/17 1544  BP: 125/85  Pulse: 80  Resp: 20  Temp: (!) 96.8 F (36 C)     Body mass index is 27.15 kg/m.    ECOG FS:0 - Asymptomatic  General: Well-developed, well-nourished, no acute distress. Eyes: Pink conjunctiva, anicteric sclera. Lungs: Clear to auscultation bilaterally. Heart: Regular rate and rhythm. No rubs, murmurs, or gallops. Abdomen: Soft, nontender, nondistended. No organomegaly noted, normoactive bowel sounds. Musculoskeletal: No edema, cyanosis, or clubbing. Neuro: Alert, answering all questions appropriately. Cranial nerves grossly intact. Skin: No rashes or petechiae noted.  No obvious bruising noted. Psych: Normal affect.  LAB RESULTS:  Lab Results  Component Value Date   NA 138 05/09/2017   K 3.9 05/09/2017   CL 106 05/09/2017   CO2 27 05/09/2017   GLUCOSE 93 05/09/2017   BUN 11 05/09/2017   CREATININE 0.72 05/09/2017   CALCIUM 8.8 (L) 05/09/2017   PROT 7.2 05/09/2017   ALBUMIN 4.3 05/09/2017   AST  19 05/09/2017   ALT 12 (L) 05/09/2017   ALKPHOS 59 05/09/2017   BILITOT 0.7 05/09/2017   GFRNONAA >60 05/09/2017   GFRAA >60 05/09/2017    Lab Results  Component Value Date   WBC 6.0 05/09/2017   NEUTROABS 4.1 05/09/2017   HGB 13.3 05/09/2017   HCT 38.9 05/09/2017   MCV 90.9 05/09/2017   PLT 227 05/09/2017     STUDIES: Mm Screening Breast Tomo Bilateral  Result Date: 05/30/2017 CLINICAL DATA:  Screening. EXAM: 2D DIGITAL SCREENING BILATERAL MAMMOGRAM WITH CAD AND ADJUNCT TOMO COMPARISON:  None. ACR Breast Density Category c: The breast tissue is heterogeneously dense, which may obscure small masses FINDINGS: There are no findings suspicious for malignancy. Images were processed with CAD. IMPRESSION: No mammographic evidence of malignancy. A result letter of this screening mammogram will be mailed directly to the patient.  RECOMMENDATION: Screening mammogram in one year. (Code:SM-B-01Y) BI-RADS CATEGORY  1: Negative. Electronically Signed   By: Curlene Dolphin M.D.   On: 05/30/2017 17:52    ASSESSMENT: Abnormal bruising.  PLAN:    1. Abnormal bruising: Unclear etiology.  All of patient's laboratory work including CBC, von Willebrand's panel, PT, PTT, and platelet function assay all within normal limits.  Continue to monitor.  After discussion with the patient, it was agreed upon that no further follow-up is necessary.   2.  Family history of hemochromatosis: Iron stores are within normal limits.  Hemochromatosis gene mutation is negative. 3.  Joint pain: ANA is negative. 4.  Bright red blood per rectum: Colonoscopy results reviewed with no obvious source of bleeding.  Continue follow-up with GI as needed.  5.  Dizziness: Unclear etiology, monitor.  Consider ENT referral. 6.  Disposition: No follow-up scheduled as above.  Approximately 30 minutes was spent in discussion of which greater than 50% was consultation.  Patient expressed understanding and was in agreement with this plan.  She also understands that She can call clinic at any time with any questions, concerns, or complaints.    Lloyd Huger, MD   06/20/2017 10:42 AM

## 2017-06-17 ENCOUNTER — Inpatient Hospital Stay (HOSPITAL_BASED_OUTPATIENT_CLINIC_OR_DEPARTMENT_OTHER): Payer: 59 | Admitting: Oncology

## 2017-06-17 ENCOUNTER — Encounter: Payer: Self-pay | Admitting: Oncology

## 2017-06-17 VITALS — BP 125/85 | HR 80 | Temp 96.8°F | Resp 20 | Wt 158.2 lb

## 2017-06-17 DIAGNOSIS — Z880 Allergy status to penicillin: Secondary | ICD-10-CM

## 2017-06-17 DIAGNOSIS — Z808 Family history of malignant neoplasm of other organs or systems: Secondary | ICD-10-CM

## 2017-06-17 DIAGNOSIS — M1990 Unspecified osteoarthritis, unspecified site: Secondary | ICD-10-CM | POA: Diagnosis not present

## 2017-06-17 DIAGNOSIS — R5381 Other malaise: Secondary | ICD-10-CM | POA: Diagnosis not present

## 2017-06-17 DIAGNOSIS — R238 Other skin changes: Secondary | ICD-10-CM | POA: Diagnosis not present

## 2017-06-17 DIAGNOSIS — R5383 Other fatigue: Secondary | ICD-10-CM

## 2017-06-17 DIAGNOSIS — Z79899 Other long term (current) drug therapy: Secondary | ICD-10-CM | POA: Diagnosis not present

## 2017-06-17 DIAGNOSIS — Z8042 Family history of malignant neoplasm of prostate: Secondary | ICD-10-CM

## 2017-06-17 DIAGNOSIS — K625 Hemorrhage of anus and rectum: Secondary | ICD-10-CM | POA: Diagnosis not present

## 2017-06-17 DIAGNOSIS — R42 Dizziness and giddiness: Secondary | ICD-10-CM | POA: Diagnosis not present

## 2017-06-17 DIAGNOSIS — R233 Spontaneous ecchymoses: Secondary | ICD-10-CM

## 2017-06-17 DIAGNOSIS — Z8049 Family history of malignant neoplasm of other genital organs: Secondary | ICD-10-CM

## 2017-06-17 NOTE — Progress Notes (Signed)
Patient denies any concerns today, here today for results.

## 2017-06-19 ENCOUNTER — Ambulatory Visit: Payer: 59 | Admitting: Family Medicine

## 2017-06-19 ENCOUNTER — Telehealth: Payer: Self-pay | Admitting: Family Medicine

## 2017-06-19 NOTE — Telephone Encounter (Signed)
Please advise 

## 2017-06-19 NOTE — Telephone Encounter (Signed)
Pt had to cancel appt that was scheduled for this afternoon due to needing to be with her mother in law because the social worker was coming by. Pt stated the appt was to follow up from seeing Dr. Grayland Ormond. Pt is requesting Dr. Caryn Section to review her visit with Dr. Grayland Ormond and to let her know if Dr. Caryn Section wants her to reschedule the f/u or if he agrees for her to see the ENT like Dr. Grayland Ormond recommended. Please advise. Thanks TNP

## 2017-06-20 NOTE — Telephone Encounter (Signed)
If she is having spinning sensation or if she has episodes that room is spinning around her, she may have inner ear problem and she should see ENT.

## 2017-06-21 NOTE — Telephone Encounter (Signed)
Patient was advised. Patient states she is not having spinning sensation, or room spinning However she is having dizziness, especially when she walks. Patient states she gets so dizzy she loses her balance.

## 2017-06-21 NOTE — Telephone Encounter (Signed)
LMOVM for pt to return call 

## 2017-06-21 NOTE — Telephone Encounter (Signed)
That doesn't sound like inner ear problem. Could be a side effect of clonidine. If stopping clonidine doesn't help she should schedule o.v. To have dizziness evaluated.

## 2017-06-27 ENCOUNTER — Encounter: Payer: Self-pay | Admitting: Oncology

## 2017-06-27 NOTE — Telephone Encounter (Signed)
Patient was advised and scheduled an appt for 07/01/2017 at 11:30am.

## 2017-07-01 ENCOUNTER — Ambulatory Visit: Payer: Self-pay | Admitting: Family Medicine

## 2017-07-08 ENCOUNTER — Ambulatory Visit: Payer: 59 | Admitting: Family Medicine

## 2017-07-08 ENCOUNTER — Encounter: Payer: Self-pay | Admitting: Family Medicine

## 2017-07-08 VITALS — BP 112/64 | HR 72 | Temp 98.9°F | Resp 16 | Wt 159.0 lb

## 2017-07-08 DIAGNOSIS — M791 Myalgia, unspecified site: Secondary | ICD-10-CM

## 2017-07-08 DIAGNOSIS — R233 Spontaneous ecchymoses: Secondary | ICD-10-CM | POA: Diagnosis not present

## 2017-07-08 DIAGNOSIS — R5383 Other fatigue: Secondary | ICD-10-CM | POA: Diagnosis not present

## 2017-07-08 LAB — POCT URINALYSIS DIPSTICK
Bilirubin, UA: NEGATIVE
Blood, UA: NEGATIVE
Glucose, UA: NEGATIVE
KETONES UA: NEGATIVE
LEUKOCYTES UA: NEGATIVE
NITRITE UA: NEGATIVE
PH UA: 7 (ref 5.0–8.0)
PROTEIN UA: NEGATIVE
SPEC GRAV UA: 1.015 (ref 1.010–1.025)
UROBILINOGEN UA: 0.2 U/dL

## 2017-07-08 NOTE — Progress Notes (Signed)
Patient: Colleen Sloan Female    DOB: November 16, 1970   46 y.o.   MRN: [id number] Visit Date: 07/08/2017  Today's Provider: Lelon Huh, MD   Chief Complaint  Patient presents with  . Bleeding/Bruising   Subjective:    HPI Abnormal Bruising:  Patient comes in reporting that she has noticed more bruising of the skin over the past 2-3 months. Patient had recently had hematology work up by Dr.Finnegan which was normal . Patient states she is still having bruising. Other associated symptoms includes red spots on her skin, fatigue, lost weight, hair loss, dizziness and myalgia. States her muscles feel sore all over, she frequently feels light headed and dizzy. No palpitations, dyspnea, or chest pains.     Wt Readings from Last 10 Encounters:  07/08/17 159 lb (72.1 kg)  06/17/17 158 lb 3.2 oz (71.8 kg)  05/31/17 153 lb (69.4 kg)  05/27/17 158 lb 4.8 oz (71.8 kg)  05/23/17 157 lb 6.4 oz (71.4 kg)  05/21/17 157 lb (71.2 kg)  05/09/17 159 lb (72.1 kg)  04/01/17 166 lb (75.3 kg)  02/09/16 170 lb (77.1 kg)  12/27/15 168 lb (76.2 kg)        Allergies  Allergen Reactions  . Augmentin [Amoxicillin-Pot Clavulanate] Anaphylaxis and Swelling     Current Outpatient Medications:  .  albuterol (PROAIR HFA) 108 (90 BASE) MCG/ACT inhaler, Inhale 1-2 puffs into the lungs every 6 (six) hours as needed for wheezing or shortness of breath., Disp: , Rfl:  .  ALPRAZolam (XANAX) 0.5 MG tablet, Take 1 tablet (0.5 mg total) by mouth 2 (two) times daily as needed for anxiety., Disp: 180 tablet, Rfl: 5 .  Cetirizine HCl (ZYRTEC ALLERGY) 10 MG CAPS, Take by mouth., Disp: , Rfl:  .  Cholecalciferol (VITAMIN D PO), Take 2,000 mg by mouth daily. , Disp: , Rfl:  .  cloNIDine (CATAPRES) 0.1 MG tablet, TAKE 1-2 TABLETS BY MOUTH  THREE TIMES A DAY IF NEEDED, Disp: 180 tablet, Rfl: 0 .  fluticasone (FLONASE) 50 MCG/ACT nasal spray, Place 2 sprays into both nostrils daily., Disp: 16 g, Rfl:  4 .  ketorolac (TORADOL) 10 MG tablet, Take 1-2 tablets at the onset of headache. No more than 4 tablets in a day, Disp: 20 tablet, Rfl: 1 .  Multiple Vitamin (MULTIVITAMIN) tablet, Take 1 tablet daily by mouth., Disp: , Rfl:  .  venlafaxine XR (EFFEXOR XR) 75 MG 24 hr capsule, Take 1 capsule (75 mg total) by mouth daily with breakfast., Disp: 90 capsule, Rfl: 4 .  zolpidem (AMBIEN) 10 MG tablet, Take 1 tablet (10 mg total) by mouth at bedtime., Disp: 90 tablet, Rfl: 1  Current Facility-Administered Medications:  .  levonorgestrel (MIRENA) 20 MCG/24HR IUD, , Intrauterine, Once, Terrance Mass, MD  Review of Systems  Constitutional: Positive for diaphoresis (night sweats), fatigue and unexpected weight change. Negative for appetite change, chills and fever.  Respiratory: Negative for chest tightness and shortness of breath.   Cardiovascular: Negative for chest pain and palpitations.  Gastrointestinal: Negative for abdominal pain, nausea and vomiting.  Neurological: Positive for dizziness and headaches. Negative for weakness.  Hematological: Bruises/bleeds easily.    Social History   Tobacco Use  . Smoking status: Never Smoker  . Smokeless tobacco: Never Used  Substance Use Topics  . Alcohol use: No    Alcohol/week: 0.0 oz   Objective:   BP 112/64 (BP Location: Left Arm, Patient Position: Sitting, Cuff Size: Normal)  Pulse 72   Temp 98.9 F (37.2 C) (Oral)   Resp 16   Wt 159 lb (72.1 kg)   SpO2 98% Comment: room air  BMI 27.29 kg/m  There were no vitals filed for this visit.   Physical Exam   General Appearance:    Alert, cooperative, no distress  Eyes:    PERRL, conjunctiva/corneas clear, EOM's intact       Lungs:     Clear to auscultation bilaterally, respirations unlabored  Heart:    Regular rate and rhythm  Neurologic:   Awake, alert, oriented x 3. No apparent focal neurological           defect.   Derm:   Scattered petechial lesions both forearms chest and  abdomen. No surrounding erythema.    u/a negative    Assessment & Plan:     1. Myalgia  - Sed Rate (ESR) - CK (Creatine Kinase) - C-reactive protein  2. Other fatigue  - Sed Rate (ESR) - CK (Creatine Kinase) - C-reactive protein - POCT Urinalysis Dipstick  3. Petechiae  - Sed Rate (ESR) - CK (Creatine Kinase) - C-reactive protein - Ambulatory referral to Dermatology 2     Lelon Huh, MD  [facility]

## 2017-07-09 LAB — C-REACTIVE PROTEIN: CRP: 0.4 mg/L (ref 0.0–4.9)

## 2017-07-09 LAB — SEDIMENTATION RATE: Sed Rate: 2 mm/hr (ref 0–32)

## 2017-07-09 LAB — CK: Total CK: 70 U/L (ref 24–173)

## 2017-07-10 ENCOUNTER — Telehealth: Payer: Self-pay | Admitting: Family Medicine

## 2017-07-10 NOTE — Telephone Encounter (Signed)
Pt called requesting referral to a dermatologist for red spots on her body.She spoke with you about this at last office visit

## 2017-08-06 ENCOUNTER — Other Ambulatory Visit: Payer: Self-pay | Admitting: Family Medicine

## 2017-08-06 DIAGNOSIS — F419 Anxiety disorder, unspecified: Secondary | ICD-10-CM

## 2017-08-23 ENCOUNTER — Encounter: Payer: Self-pay | Admitting: Physician Assistant

## 2017-08-23 ENCOUNTER — Ambulatory Visit: Payer: Managed Care, Other (non HMO) | Admitting: Physician Assistant

## 2017-08-23 VITALS — BP 138/80 | HR 72 | Temp 98.6°F | Resp 16 | Wt 159.0 lb

## 2017-08-23 DIAGNOSIS — J0140 Acute pansinusitis, unspecified: Secondary | ICD-10-CM | POA: Diagnosis not present

## 2017-08-23 DIAGNOSIS — J4521 Mild intermittent asthma with (acute) exacerbation: Secondary | ICD-10-CM

## 2017-08-23 MED ORDER — PREDNISONE 10 MG (21) PO TBPK: ORAL_TABLET | ORAL | 0 refills | Status: DC

## 2017-08-23 MED ORDER — LEVOFLOXACIN 750 MG PO TABS: 750.0000 mg | ORAL_TABLET | Freq: Every day | ORAL | 0 refills | Status: DC

## 2017-08-23 MED ORDER — ALBUTEROL SULFATE HFA 108 (90 BASE) MCG/ACT IN AERS: 1.0000 | INHALATION_SPRAY | Freq: Four times a day (QID) | RESPIRATORY_TRACT | 0 refills | Status: DC | PRN

## 2017-08-23 MED ORDER — HYDROCOD POLST-CPM POLST ER 10-8 MG/5ML PO SUER: 5.0000 mL | Freq: Two times a day (BID) | ORAL | 0 refills | Status: DC | PRN

## 2017-08-23 NOTE — Progress Notes (Signed)
Patient: Colleen Sloan Female    DOB: Sep 22, 1970   47 y.o.   MRN: [id number] Visit Date: 08/23/2017  Today's Provider: Mar Daring, PA-C   Chief Complaint  Patient presents with  . URI   Subjective:    HPI Upper Respiratory Infection: Patient complains of symptoms of a URI, bronchitis. Symptoms include congestion and cough. Onset of symptoms was 3 days ago, gradually worsening since that time. She also c/o cough described as nocturnal and worsening over time, low grade fever, shortness of breath and wheezing for the past 3 days .  She is drinking plenty of fluids. Evaluation to date: none. Treatment to date: cough suppressants and decongestants.     Allergies  Allergen Reactions  . Augmentin [Amoxicillin-Pot Clavulanate] Anaphylaxis and Swelling     Current Outpatient Medications:  .  ALPRAZolam (XANAX) 0.5 MG tablet, Take 1 tablet (0.5 mg total) by mouth 2 (two) times daily as needed for anxiety., Disp: 180 tablet, Rfl: 5 .  Cetirizine HCl (ZYRTEC ALLERGY) 10 MG CAPS, Take by mouth., Disp: , Rfl:  .  Cholecalciferol (VITAMIN D PO), Take 2,000 mg by mouth daily. , Disp: , Rfl:  .  fluticasone (FLONASE) 50 MCG/ACT nasal spray, Place 2 sprays into both nostrils daily., Disp: 16 g, Rfl: 4 .  ketorolac (TORADOL) 10 MG tablet, Take 1-2 tablets at the onset of headache. No more than 4 tablets in a day, Disp: 20 tablet, Rfl: 1 .  Multiple Vitamin (MULTIVITAMIN) tablet, Take 1 tablet daily by mouth., Disp: , Rfl:  .  venlafaxine XR (EFFEXOR-XR) 75 MG 24 hr capsule, TAKE 1 CAPSULE BY MOUTH  DAILY WITH BREAKFAST, Disp: 90 capsule, Rfl: 4 .  zolpidem (AMBIEN) 10 MG tablet, Take 1 tablet (10 mg total) by mouth at bedtime., Disp: 90 tablet, Rfl: 1 .  albuterol (PROAIR HFA) 108 (90 BASE) MCG/ACT inhaler, Inhale 1-2 puffs into the lungs every 6 (six) hours as needed for wheezing or shortness of breath., Disp: , Rfl:  .  cloNIDine (CATAPRES) 0.1 MG tablet, TAKE 1-2  TABLETS BY MOUTH  THREE TIMES A DAY IF NEEDED (Patient not taking: Reported on 08/23/2017), Disp: 180 tablet, Rfl: 0  Current Facility-Administered Medications:  .  levonorgestrel (MIRENA) 20 MCG/24HR IUD, , Intrauterine, Once, Terrance Mass, MD  Review of Systems  Constitutional: Positive for fatigue and fever.  HENT: Positive for congestion and sinus pressure.   Respiratory: Positive for cough, chest tightness, shortness of breath and wheezing.   Cardiovascular: Negative.   Gastrointestinal: Negative.   Neurological: Negative.     Social History   Tobacco Use  . Smoking status: Never Smoker  . Smokeless tobacco: Never Used  Substance Use Topics  . Alcohol use: No    Alcohol/week: 0.0 oz   Objective:   BP 138/80 (BP Location: Left Arm, Patient Position: Sitting, Cuff Size: Normal)   Pulse 72   Temp 98.6 F (37 C) (Oral)   Resp 16   Wt 159 lb (72.1 kg)   SpO2 98%   BMI 27.29 kg/m  Vitals:   08/23/17 1353  BP: 138/80  Pulse: 72  Resp: 16  Temp: 98.6 F (37 C)  TempSrc: Oral  SpO2: 98%  Weight: 159 lb (72.1 kg)     Physical Exam  Constitutional: She appears well-developed and well-nourished. No distress.  HENT:  Head: Normocephalic and atraumatic.  Right Ear: Hearing, tympanic membrane, external ear and ear canal normal.  Left Ear: Hearing,  tympanic membrane, external ear and ear canal normal.  Nose: Right sinus exhibits maxillary sinus tenderness and frontal sinus tenderness. Left sinus exhibits maxillary sinus tenderness and frontal sinus tenderness.  Mouth/Throat: Uvula is midline, oropharynx is clear and moist and mucous membranes are normal. No oropharyngeal exudate.  Neck: Normal range of motion. Neck supple. No tracheal deviation present. No thyromegaly present.  Cardiovascular: Normal rate, regular rhythm and normal heart sounds. Exam reveals no gallop and no friction rub.  No murmur heard. Pulmonary/Chest: Effort normal. No stridor. No respiratory  distress. She has decreased breath sounds. She has no wheezes. She has no rhonchi. She has no rales.  Lymphadenopathy:    She has no cervical adenopathy.  Skin: She is not diaphoretic.  Vitals reviewed.      Assessment & Plan:     1. Acute pansinusitis, recurrence not specified Worsening symptoms that have not responded to OTC medications. Will give Levaquin as below. Continue allergy medications. Stay well hydrated and get plenty of rest. Call if no symptom improvement or if symptoms worsen. - levofloxacin (LEVAQUIN) 750 MG tablet; Take 1 tablet (750 mg total) by mouth daily.  Dispense: 7 tablet; Refill: 0 - predniSONE (STERAPRED UNI-PAK 21 TAB) 10 MG (21) TBPK tablet; 6 day; Take as directed on package instructions  Dispense: 21 tablet; Refill: 0  2. Mild intermittent reactive airway disease with acute exacerbation Worsening. Albuterol refilled. Prednisone taper. Tussionex for nighttime cough. Call if symptoms worsen. - albuterol (PROAIR HFA) 108 (90 Base) MCG/ACT inhaler; Inhale 1-2 puffs into the lungs every 6 (six) hours as needed for wheezing or shortness of breath.  Dispense: 1 Inhaler; Refill: 0 - predniSONE (STERAPRED UNI-PAK 21 TAB) 10 MG (21) TBPK tablet; 6 day; Take as directed on package instructions  Dispense: 21 tablet; Refill: 0 - chlorpheniramine-HYDROcodone (TUSSIONEX PENNKINETIC ER) 10-8 MG/5ML SUER; Take 5 mLs by mouth every 12 (twelve) hours as needed for cough.  Dispense: 140 mL; Refill: 0       Mar Daring, PA-C  [facility]

## 2017-08-23 NOTE — Patient Instructions (Signed)

## 2017-09-23 ENCOUNTER — Other Ambulatory Visit: Payer: Self-pay | Admitting: Family Medicine

## 2017-09-23 DIAGNOSIS — F1120 Opioid dependence, uncomplicated: Secondary | ICD-10-CM

## 2017-09-26 ENCOUNTER — Other Ambulatory Visit: Payer: Self-pay | Admitting: Family Medicine

## 2017-09-26 DIAGNOSIS — F410 Panic disorder [episodic paroxysmal anxiety] without agoraphobia: Secondary | ICD-10-CM

## 2017-09-26 MED ORDER — ALPRAZOLAM 0.5 MG PO TABS: 0.5000 mg | ORAL_TABLET | Freq: Two times a day (BID) | ORAL | 5 refills | Status: DC | PRN

## 2017-09-26 MED ORDER — ZOLPIDEM TARTRATE 10 MG PO TABS: 10.0000 mg | ORAL_TABLET | Freq: Every day | ORAL | 1 refills | Status: DC

## 2017-09-26 NOTE — Telephone Encounter (Signed)
[facility] faxed a refill request for the following medications. Thanks CC  ALPRAZolam (XANAX) 0.5 MG tablet   zolpidem (AMBIEN) 10 MG tablet

## 2017-11-01 ENCOUNTER — Ambulatory Visit: Payer: Managed Care, Other (non HMO) | Admitting: Physician Assistant

## 2017-11-01 ENCOUNTER — Encounter: Payer: Self-pay | Admitting: Physician Assistant

## 2017-11-01 DIAGNOSIS — J0140 Acute pansinusitis, unspecified: Secondary | ICD-10-CM | POA: Diagnosis not present

## 2017-11-01 DIAGNOSIS — J4521 Mild intermittent asthma with (acute) exacerbation: Secondary | ICD-10-CM

## 2017-11-01 MED ORDER — PREDNISONE 10 MG (21) PO TBPK: ORAL_TABLET | ORAL | 0 refills | Status: DC

## 2017-11-01 MED ORDER — HYDROCOD POLST-CPM POLST ER 10-8 MG/5ML PO SUER: 5.0000 mL | Freq: Two times a day (BID) | ORAL | 0 refills | Status: DC | PRN

## 2017-11-01 MED ORDER — ALBUTEROL SULFATE HFA 108 (90 BASE) MCG/ACT IN AERS: 1.0000 | INHALATION_SPRAY | Freq: Four times a day (QID) | RESPIRATORY_TRACT | 0 refills | Status: DC | PRN

## 2017-11-01 MED ORDER — LEVOFLOXACIN 750 MG PO TABS: 750.0000 mg | ORAL_TABLET | Freq: Every day | ORAL | 0 refills | Status: DC

## 2017-11-01 NOTE — Patient Instructions (Signed)

## 2017-11-01 NOTE — Progress Notes (Signed)
Patient: Colleen Sloan Female    DOB: [birth year]   47 y.o.   MRN: [id number] Visit Date: 11/01/2017  Today's Provider: Mar Daring, PA-C   Chief Complaint  Patient presents with  . URI   Subjective:    HPI Upper Respiratory Infection: Patient complains of symptoms of a URI, possible sinusitis. Symptoms include congestion and cough. Onset of symptoms was 1 week ago, gradually worsening since that time. She also c/o congestion, headache described as pressure and non productive cough for the past 1 week .  She is drinking plenty of fluids. Evaluation to date: none. Treatment to date: antihistamines and nasal steroids. Patient reports coughing non stop since 2 am this morning.      Allergies  Allergen Reactions  . Augmentin [Amoxicillin-Pot Clavulanate] Anaphylaxis and Swelling     Current Outpatient Medications:  .  albuterol (PROAIR HFA) 108 (90 Base) MCG/ACT inhaler, Inhale 1-2 puffs into the lungs every 6 (six) hours as needed for wheezing or shortness of breath., Disp: 1 Inhaler, Rfl: 0 .  ALPRAZolam (XANAX) 0.5 MG tablet, Take 1 tablet (0.5 mg total) by mouth 2 (two) times daily as needed for anxiety., Disp: 180 tablet, Rfl: 5 .  Cetirizine HCl (ZYRTEC ALLERGY) 10 MG CAPS, Take by mouth., Disp: , Rfl:  .  Cholecalciferol (VITAMIN D PO), Take 2,000 mg by mouth daily. , Disp: , Rfl:  .  cloNIDine (CATAPRES) 0.1 MG tablet, TAKE 1-2 TABLETS BY MOUTH  THREE TIMES A DAY IF NEEDED, Disp: 180 tablet, Rfl: 2 .  fluticasone (FLONASE) 50 MCG/ACT nasal spray, Place 2 sprays into both nostrils daily., Disp: 16 g, Rfl: 4 .  ketorolac (TORADOL) 10 MG tablet, Take 1-2 tablets at the onset of headache. No more than 4 tablets in a day, Disp: 20 tablet, Rfl: 1 .  Multiple Vitamin (MULTIVITAMIN) tablet, Take 1 tablet daily by mouth., Disp: , Rfl:  .  venlafaxine XR (EFFEXOR-XR) 75 MG 24 hr capsule, TAKE 1 CAPSULE BY MOUTH  DAILY WITH BREAKFAST, Disp: 90 capsule, Rfl: 4 .   zolpidem (AMBIEN) 10 MG tablet, Take 1 tablet (10 mg total) by mouth at bedtime., Disp: 90 tablet, Rfl: 1  Current Facility-Administered Medications:  .  levonorgestrel (MIRENA) 20 MCG/24HR IUD, , Intrauterine, Once, Terrance Mass, MD  Review of Systems  Constitutional: Negative.   HENT: Positive for congestion, postnasal drip, sinus pressure, sinus pain and sore throat. Negative for ear pain (fullness).   Respiratory: Positive for cough and chest tightness. Negative for shortness of breath and wheezing.   Cardiovascular: Negative.   Gastrointestinal: Negative.   Allergic/Immunologic: Positive for environmental allergies.  Neurological: Positive for headaches.    Social History   Tobacco Use  . Smoking status: Never Smoker  . Smokeless tobacco: Never Used  Substance Use Topics  . Alcohol use: No    Alcohol/week: 0.0 oz   Objective:   BP 100/66 (BP Location: Left Arm, Patient Position: Sitting, Cuff Size: Normal)   Pulse 68   Temp 98.2 F (36.8 C) (Oral)   Resp 16   Wt 161 lb (73 kg)   SpO2 99%   BMI 27.64 kg/m  Vitals:   11/01/17 1138  BP: 100/66  Pulse: 68  Resp: 16  Temp: 98.2 F (36.8 C)  TempSrc: Oral  SpO2: 99%  Weight: 161 lb (73 kg)     Physical Exam  Constitutional: She appears well-developed and well-nourished. No distress.  HENT:  Head: Normocephalic  and atraumatic.  Right Ear: Hearing, tympanic membrane, external ear and ear canal normal.  Left Ear: Hearing, tympanic membrane, external ear and ear canal normal.  Nose: Right sinus exhibits maxillary sinus tenderness and frontal sinus tenderness. Left sinus exhibits maxillary sinus tenderness and frontal sinus tenderness.  Mouth/Throat: Uvula is midline, oropharynx is clear and moist and mucous membranes are normal. No oropharyngeal exudate.  Neck: Normal range of motion. Neck supple. No tracheal deviation present. No thyromegaly present.  Cardiovascular: Normal rate, regular rhythm and normal  heart sounds. Exam reveals no gallop and no friction rub.  No murmur heard. Pulmonary/Chest: Effort normal. No stridor. No respiratory distress. She has wheezes (fine expiratory throughout). She has no rales.  Lymphadenopathy:    She has no cervical adenopathy.  Skin: She is not diaphoretic.  Vitals reviewed.      Assessment & Plan:     1. Mild intermittent reactive airway disease with acute exacerbation Worsening. Failed OTC treatments. Will treat with Levaquin, prednisone, albuterol inhaler and tussionex for nighttime cough. Push fluids. Rest. Continue allergy medications. Call if symptoms worsen.  - albuterol (PROAIR HFA) 108 (90 Base) MCG/ACT inhaler; Inhale 1-2 puffs into the lungs every 6 (six) hours as needed for wheezing or shortness of breath.  Dispense: 1 Inhaler; Refill: 0 - predniSONE (STERAPRED UNI-PAK 21 TAB) 10 MG (21) TBPK tablet; 6 day; Take as directed on package instructions  Dispense: 21 tablet; Refill: 0 - chlorpheniramine-HYDROcodone (TUSSIONEX PENNKINETIC ER) 10-8 MG/5ML SUER; Take 5 mLs by mouth every 12 (twelve) hours as needed for cough.  Dispense: 140 mL; Refill: 0  2. Acute pansinusitis, recurrence not specified See above medical treatment plan. - predniSONE (STERAPRED UNI-PAK 21 TAB) 10 MG (21) TBPK tablet; 6 day; Take as directed on package instructions  Dispense: 21 tablet; Refill: 0 - levofloxacin (LEVAQUIN) 750 MG tablet; Take 1 tablet (750 mg total) by mouth daily.  Dispense: 7 tablet; Refill: 0       Mar Daring, PA-C  [facility]

## 2017-11-09 ENCOUNTER — Ambulatory Visit (INDEPENDENT_AMBULATORY_CARE_PROVIDER_SITE_OTHER): Payer: Managed Care, Other (non HMO)

## 2017-11-09 ENCOUNTER — Ambulatory Visit
Admission: EM | Admit: 2017-11-09 | Discharge: 2017-11-09 | Disposition: A | Discharge status: Home / Self Care | Payer: Managed Care, Other (non HMO) | Attending: Family Medicine | Admitting: Family Medicine

## 2017-11-09 ENCOUNTER — Other Ambulatory Visit: Payer: Self-pay

## 2017-11-09 DIAGNOSIS — S93401A Sprain of unspecified ligament of right ankle, initial encounter: Secondary | ICD-10-CM

## 2017-11-09 HISTORY — DX: Depression, unspecified: F32.A

## 2017-11-09 HISTORY — DX: Major depressive disorder, single episode, unspecified: F32.9

## 2017-11-09 NOTE — ED Notes (Signed)
Air cast to right ankle.

## 2017-11-09 NOTE — ED Provider Notes (Signed)
[facility]  CSN: [id number] Arrival date & time: 11/09/17  1412  History   Chief Complaint Chief Complaint  Patient presents with  . Ankle Pain   HPI  47 year old female female presents with an ankle injury.  Patient reports that she slipped on a garage step and twisted her ankle this morning.  Right ankle.  She reports severe pain and swelling both medially and laterally.  Pain is currently 9/10 in severity.  Difficulty ambulating.  Worse with range of motion.  No medications or interventions tried.  No other associated symptoms.  No other complaints.  Past Medical History:  Diagnosis Date  . Arthritis    throughout body  . Asthma   . Depression   . Migraines    TAKES TYLOX PRN   Patient Active Problem List   Diagnosis Date Noted  . Blood in stool   . Benign neoplasm of ascending colon   . Benign neoplasm of transverse colon   . Abnormal bruising 05/22/2017  . Vitamin D deficiency 04/01/2017  . Acute anxiety 02/10/2016  . Allergic rhinitis 12/30/2014  . Insomnia 12/30/2014  . Panic disorder 12/30/2014  . RAD (reactive airway disease) 12/30/2014  . Migraine 12/24/2014  . Migraines    Past Surgical History:  Procedure Laterality Date  . ANKLE SURGERY Right 2003   Reconstruct Ligaments  . COLONOSCOPY WITH PROPOFOL N/A 05/31/2017   Procedure: COLONOSCOPY WITH PROPOFOL;  Surgeon: Lucilla Lame, MD;  Location: [place];  Service: Endoscopy;  Laterality: N/A;  . DILATION AND CURETTAGE OF UTERUS  1996  . HAND SURGERY  1993  . INTRAUTERINE DEVICE INSERTION  04/02/2014   [place], 2001   X 2 Removal of Cysts  . POLYPECTOMY  05/31/2017   Procedure: POLYPECTOMY;  Surgeon: Lucilla Lame, MD;  Location: [place];  Service: Endoscopy;;  . Thumb surg     OB History    Gravida  5   Para  3   Term      Preterm      AB  2   Living  3     SAB      TAB      Ectopic      Multiple      Live Births               Home Medications    Prior to Admission medications   Medication Sig Start Date End Date Taking? Authorizing Provider  albuterol (PROAIR HFA) 108 (90 Base) MCG/ACT inhaler Inhale 1-2 puffs into the lungs every 6 (six) hours as needed for wheezing or shortness of breath. 11/01/17   Mar Daring, PA-C  ALPRAZolam Duanne Moron) 0.5 MG tablet Take 1 tablet (0.5 mg total) by mouth 2 (two) times daily as needed for anxiety. 09/26/17   Birdie Sons, MD  Cetirizine HCl (ZYRTEC ALLERGY) 10 MG CAPS Take by mouth.    [provider]  Cholecalciferol (VITAMIN D PO) Take 2,000 mg by mouth daily.     [provider]  cloNIDine (CATAPRES) 0.1 MG tablet TAKE 1-2 TABLETS BY MOUTH  THREE TIMES A DAY IF NEEDED 09/23/17   Birdie Sons, MD  fluticasone (FLONASE) 50 MCG/ACT nasal spray Place 2 sprays into both nostrils daily. 05/21/17   Birdie Sons, MD  Multiple Vitamin (MULTIVITAMIN) tablet Take 1 tablet daily by mouth.    [provider]  venlafaxine XR (EFFEXOR-XR) 75 MG 24 hr capsule  TAKE 1 CAPSULE BY MOUTH  DAILY WITH BREAKFAST 08/06/17   Birdie Sons, MD  zolpidem (AMBIEN) 10 MG tablet Take 1 tablet (10 mg total) by mouth at bedtime. 09/26/17   Birdie Sons, MD    Family History Family History  Problem Relation Age of Onset  . Hypertension Father   . Hemachromatosis Father   . Depression Mother   . Anxiety disorder Mother   . Thyroid cancer Mother   . Bipolar disorder Sister   . Mental illness Sister   . Cervical cancer Sister   . Skin cancer Sister   . Bipolar disorder Maternal Aunt   . Cancer Maternal Aunt        BRAIN  . Other Maternal Grandmother        ANXIETY  . Asthma Maternal Grandmother   . Cancer Paternal Grandmother        LIVER  . Prostate cancer Paternal Grandfather   . Skin cancer Paternal Grandfather   . Breast cancer Neg Hx    Social History Social History   Tobacco Use  . Smoking status: Never Smoker  . Smokeless tobacco: Never  Used  Substance Use Topics  . Alcohol use: No    Alcohol/week: 0.0 oz  . Drug use: No    Allergies   Augmentin [amoxicillin-pot clavulanate]  Review of Systems Review of Systems  Constitutional: Negative.   Musculoskeletal:       Right ankle pain, swelling.   Physical Exam Triage Vital Signs ED Triage Vitals  Enc Vitals Group     BP 11/09/17 1428 134/81     Pulse Rate 11/09/17 1428 80     Resp 11/09/17 1428 18     Temp 11/09/17 1428 98.7 F (37.1 C)     Temp Source 11/09/17 1428 Oral     SpO2 11/09/17 1428 100 %     Weight 11/09/17 1429 158 lb (71.7 kg)     Height 11/09/17 1429 5\' 4"  (1.626 m)     Head Circumference --      Peak Flow --      Pain Score 11/09/17 1429 9     Pain Loc --      Pain Edu? --      Excl. in [place]? --    Updated Vital Signs BP 134/81 (BP Location: Right Arm)   Pulse 80   Temp 98.7 F (37.1 C) (Oral)   Resp 18   Ht 5\' 4"  (1.626 m)   Wt 158 lb (71.7 kg)   SpO2 100%   BMI 27.12 kg/m   Physical Exam  Constitutional: She is oriented to person, place, and time. She appears well-developed. No distress.  Cardiovascular: Normal rate and regular rhythm.  Pulmonary/Chest: Effort normal and breath sounds normal.  Musculoskeletal:  Right ankle -patient was swelling particularly in the lateral malleolus.  Exquisitely tender to palpation of the medial and lateral malleolus.  Decreased range of motion secondary to pain.  Neurological: She is alert and oriented to person, place, and time.  Skin: Skin is warm. No rash noted.  Psychiatric: She has a normal mood and affect. Her behavior is normal.  Nursing note and vitals reviewed.  UC Treatments / Results  Labs (all labs ordered are listed, but only abnormal results are displayed) Labs Reviewed - No data to display  EKG None Radiology Dg Ankle Complete Right  Result Date: 11/09/2017 CLINICAL DATA:  Twisting injury with ankle pain, initial encounter EXAM: RIGHT ANKLE - COMPLETE 3+ VIEW  COMPARISON:   None. FINDINGS: Postsurgical changes are noted in the distal fibula. Mild lateral soft tissue swelling is noted. Some small bony fragments are noted adjacent to the distal aspect of the fibula which may be related to prior trauma. No other focal abnormality is noted. IMPRESSION: Postsurgical changes. Small bony densities adjacent to the distal aspect of the fibula which are of uncertain chronicity. Electronically Signed   By: Inez Catalina M.D.   On: 11/09/2017 14:52    Procedures Procedures (including critical care time)  Medications Ordered in UC Medications - No data to display   Initial Impression / Assessment and Plan / UC Course  I have reviewed the triage vital signs and the nursing notes.  Pertinent labs & imaging results that were available during my care of the patient were reviewed by me and considered in my medical decision making (see chart for details).    47 year old female presents with an ankle sprain.  X-ray negative.  Rest, ice, compression, elevation.  Placed in Airc[employer] and given crutches.  Patient was recently given tramadol (#90 on 4/18). PRN NSAID's.  Final Clinical Impressions(s) / UC Diagnoses   Final diagnoses:  Sprain of right ankle, unspecified ligament, initial encounter    ED Discharge Orders    None     Controlled Substance Prescriptions [place] Controlled Substance Registry consulted? Yes, I have consulted the [place] Controlled Substances Registry for this patient. No controlled Rx's today. Recently received #90 of Tramadol on 4/18.   Coral Spikes, [place] 11/09/17 [phone]

## 2017-11-09 NOTE — ED Triage Notes (Signed)
Pt slipped on garage step and twisted her ankle causing her to fall. Right ankle pain and swelling 9/10 medial and lateral

## 2018-01-29 HISTORY — PX: ANKLE SURGERY: SHX546

## 2018-03-18 ENCOUNTER — Other Ambulatory Visit: Payer: Self-pay | Admitting: Family Medicine

## 2018-03-18 DIAGNOSIS — F410 Panic disorder [episodic paroxysmal anxiety] without agoraphobia: Secondary | ICD-10-CM

## 2018-03-31 ENCOUNTER — Telehealth: Payer: Self-pay | Admitting: Family Medicine

## 2018-03-31 NOTE — Telephone Encounter (Signed)
Please advise patient that she cannot take oxycodone with alprazolam. [facility] will not dispense it and we cannot approve refill as long as she is taking oxycodone. If she gets off of oxycodone we can res[facility]. Its up to her.

## 2018-04-01 NOTE — Telephone Encounter (Signed)
Patient report she is going back on Thursday to see the Orthopedics and see if she needs to continue with the oxycodone or not and she will be calling to let us know. She reports she had reconstruction on her right ankle/foot.  CB# [phone]

## 2018-04-01 NOTE — Telephone Encounter (Signed)
Please advise 

## 2018-04-08 ENCOUNTER — Other Ambulatory Visit: Payer: Self-pay | Admitting: Family Medicine

## 2018-04-08 DIAGNOSIS — F410 Panic disorder [episodic paroxysmal anxiety] without agoraphobia: Secondary | ICD-10-CM

## 2018-04-08 MED ORDER — ALPRAZOLAM 0.5 MG PO TABS: ORAL_TABLET | ORAL | 1 refills | Status: DC

## 2018-04-08 NOTE — Telephone Encounter (Signed)
Pt called saying she needs a refill on the generic xanax 0.5 mg  She is off the oxycodone now.  She was taking the oxycodone because she had surgery on foot/    [facility] would not fill the xanax as long as she was taking the oxycodone.  ALPRAZolam (XANAX) 0.5 MG tablet  [facility]   Pt's CAll back [phone]

## 2018-04-09 ENCOUNTER — Telehealth: Payer: Self-pay | Admitting: Family Medicine

## 2018-04-09 NOTE — Telephone Encounter (Signed)
Pt stated that [facility] will not fill pt's Rx for ALPRAZolam Duanne Moron) 0.5 MG tablet until we send them a notice that pt is no longer taking Oxycodone or Tramadol. Pt is requesting this be [facility] to [facility] if possible. Please advise. Thanks TNP

## 2018-04-10 NOTE — Telephone Encounter (Signed)
Please advise 

## 2018-04-10 NOTE — Telephone Encounter (Signed)
Per PMP aware 180 alprazolam dispensed by [facility] on 04-08-18

## 2018-05-13 ENCOUNTER — Telehealth: Payer: Self-pay | Admitting: Family Medicine

## 2018-05-13 DIAGNOSIS — J301 Allergic rhinitis due to pollen: Secondary | ICD-10-CM

## 2018-05-13 MED ORDER — FLUTICASONE PROPIONATE 50 MCG/ACT NA SUSP: 2.0000 | Freq: Every day | NASAL | 4 refills | Status: DC

## 2018-05-13 MED ORDER — CETIRIZINE HCL 10 MG PO CAPS: 10.0000 mg | ORAL_CAPSULE | Freq: Every day | ORAL | 4 refills | Status: DC

## 2018-05-13 NOTE — Telephone Encounter (Signed)
Patient is wanting Flonase and Zyrtec refills for alllergies.  Needs 3 month supply sent to [facility].

## 2018-08-15 ENCOUNTER — Other Ambulatory Visit: Payer: Self-pay | Admitting: Family Medicine

## 2018-08-15 DIAGNOSIS — F419 Anxiety disorder, unspecified: Secondary | ICD-10-CM

## 2018-09-06 ENCOUNTER — Other Ambulatory Visit: Payer: Self-pay | Admitting: Family Medicine

## 2018-09-06 DIAGNOSIS — F410 Panic disorder [episodic paroxysmal anxiety] without agoraphobia: Secondary | ICD-10-CM

## 2018-09-06 NOTE — Telephone Encounter (Signed)
[facility] faxed refill request for the following medications:  zolpidem (AMBIEN) 10 MG tablet   Please advise.

## 2018-09-09 MED ORDER — ZOLPIDEM TARTRATE 10 MG PO TABS: 10.0000 mg | ORAL_TABLET | Freq: Every evening | ORAL | 1 refills | Status: DC | PRN

## 2018-09-09 NOTE — Telephone Encounter (Signed)
We received another fax requesting the following medication  zolpidem (AMBIEN) 10 MG tablet  Looks like ALPRAZolam (XANAX) 0.5 MG tablet was sent but not the ambien.

## 2018-09-09 NOTE — Addendum Note (Signed)
Addended by: Ashley Royalty E on: 09/09/2018 09:17 AM   Modules accepted: Orders

## 2018-11-15 ENCOUNTER — Other Ambulatory Visit: Payer: Self-pay | Admitting: Family Medicine

## 2018-11-15 DIAGNOSIS — F1120 Opioid dependence, uncomplicated: Secondary | ICD-10-CM

## 2018-11-15 DIAGNOSIS — F410 Panic disorder [episodic paroxysmal anxiety] without agoraphobia: Secondary | ICD-10-CM

## 2018-12-04 ENCOUNTER — Other Ambulatory Visit: Payer: Self-pay

## 2018-12-04 DIAGNOSIS — F410 Panic disorder [episodic paroxysmal anxiety] without agoraphobia: Secondary | ICD-10-CM

## 2018-12-04 NOTE — Telephone Encounter (Signed)
Patient is requesting a refill on Alprazolam 

## 2018-12-08 ENCOUNTER — Other Ambulatory Visit: Payer: Self-pay

## 2018-12-08 DIAGNOSIS — F410 Panic disorder [episodic paroxysmal anxiety] without agoraphobia: Secondary | ICD-10-CM

## 2018-12-08 NOTE — Telephone Encounter (Signed)
Patient is requesting a refill on her Xanax 0.5 MG. L.O.V. 11/01/2017, please advise.

## 2018-12-12 ENCOUNTER — Ambulatory Visit (INDEPENDENT_AMBULATORY_CARE_PROVIDER_SITE_OTHER): Payer: Managed Care, Other (non HMO) | Admitting: Family Medicine

## 2018-12-12 ENCOUNTER — Other Ambulatory Visit: Payer: Self-pay

## 2018-12-12 ENCOUNTER — Encounter: Payer: Self-pay | Admitting: Family Medicine

## 2018-12-12 DIAGNOSIS — G8929 Other chronic pain: Secondary | ICD-10-CM

## 2018-12-12 DIAGNOSIS — F1120 Opioid dependence, uncomplicated: Secondary | ICD-10-CM

## 2018-12-12 DIAGNOSIS — F410 Panic disorder [episodic paroxysmal anxiety] without agoraphobia: Secondary | ICD-10-CM

## 2018-12-12 DIAGNOSIS — M79671 Pain in right foot: Secondary | ICD-10-CM | POA: Diagnosis not present

## 2018-12-12 DIAGNOSIS — F419 Anxiety disorder, unspecified: Secondary | ICD-10-CM

## 2018-12-12 MED ORDER — ZOLPIDEM TARTRATE 10 MG PO TABS: 5.0000 mg | ORAL_TABLET | Freq: Every evening | ORAL | 0 refills | Status: DC | PRN

## 2018-12-12 MED ORDER — ALPRAZOLAM 0.5 MG PO TABS: 0.2500 mg | ORAL_TABLET | Freq: Two times a day (BID) | ORAL | 0 refills | Status: DC | PRN

## 2018-12-12 MED ORDER — VENLAFAXINE HCL ER 75 MG PO CP24: 150.0000 mg | ORAL_CAPSULE | ORAL | 0 refills | Status: DC

## 2018-12-12 NOTE — Patient Instructions (Signed)
. Tramadol, oxycodone, zolpidem, and alprazolam all depress your central nervous system and there is a high risk of overdose when taking these in combination. Keep the doses as low as possible   Check the expiration date on your Narcan prescription every week. Call for a new prescription at least 2 weeks before it expires   You can increase the dose of venlafaxine (Effexor) to 2 tablets every morning. We will give you a call in a few weeks to see how this is working. If it is working better, then we'll send in a new prescription for 150mg  capsules.      Opioid Overdose Opioids are substances that relieve pain by binding to pain receptors in your brain and spinal cord. Opioids include illegal drugs, such as heroin, as well as prescription pain medicines.An opioid overdose happens when you take too much of an opioid substance. This can happen with any type of opioid, including:  Heroin.  Morphine.  Codeine.  Methadone.  Oxycodone.  Hydrocodone.  Fentanyl.  Hydromorphone.  Buprenorphine. The effects of an overdose can be mild, dangerous, or even deadly. Opioid overdose is a medical emergency. What are the causes? This condition may be caused by:  Taking too much of an opioid by accident.  Taking too much of an opioid on purpose.  An error made by a health care provider who prescribes a medicine.  An error made by the pharmacist who fills the prescription order.  Using more than one substance that contains opioids at the same time.  Mixing an opioid with a substance that affects your heart, breathing, or blood pressure. These include alcohol, tranquilizers, sleeping pills, illegal drugs, and some over-the-counter medicines. What increases the risk? This condition is more likely in:  Children. They may be attracted to colorful pills. Because of a child's small size, even a small amount of a drug can be dangerous.  Elderly people. They may be taking many different drugs.  Elderly people may have difficulty reading labels or remembering when they last took their medicine.  People who take an opioid on a long-term basis.  People who use: ? Illegal drugs. ? Other substances, including alcohol, while using an opioid.  People who have: ? A history of drug or alcohol abuse. ? Certain mental health conditions.  People who take opioids that are not prescribed for them. What are the signs or symptoms? Symptoms of this condition depend on the type of opioid and the amount that was taken. Common symptoms include:  Sleepiness or difficulty waking from sleep.  Confusion.  Slurred speech.  Slowed breathing and a slow pulse.  Nausea and vomiting.  Abnormally small pupils. Signs and symptoms that require emergency treatment include:  Cold, clammy, and pale skin.  Blue lips and fingernails.  Vomiting.  Gurgling sounds in the throat.  A pulse that is very slow or difficult to detect.  Breathing that is very slow, noisy, or difficult to detect.  Limp body.  Inability to respond to speech or be awakened from sleep (stupor). How is this diagnosed? This condition is diagnosed based on your symptoms. It is important to tell your health care provider:  All of the opioidsthat you took.  When you took the opioids.  Whether you were drinking alcohol or using other substances. Your health care provider will do a physical exam. This exam may include:  Checking and monitoring your heart rate and rhythm, your breathing rate and depth, your temperature, and your blood pressure (vital signs).  Checking for abnormally small pupils.  Measuring oxygen levels in your blood. You may also have blood tests or urine tests. How is this treated? Supporting your vital signs and your breathing is the first step in treating an opioid overdose. Treatment may also include:  Giving fluids and minerals (electrolytes) through an IV tube.  Inserting a breathing tube  (endotracheal tube) in your airway to help you breathe.  Giving oxygen.  Passing a tube through your nose and into your stomach (NG tube, or nasogastric tube) to wash out your stomach.  Giving medicines that: ? Increase your blood pressure. ? Absorb any opioid that is in your digestive system. ? Reverse the effects of the opioid (naloxone).  Ongoing counseling and mental health support if you intentionally overdosed or used an illegal drug. Follow these instructions at home:   Take over-the-counter and prescription medicines only as told by your health care provider. Always ask your health care provider about possible side effects and interactions of any new medicine that you start taking.  Keep a list of all of the medicines that you take, including over-the-counter medicines. Bring this list with you to all of your medical visits.  Drink enough fluid to keep your urine clear or pale yellow.  Keep all follow-up visits as told by your health care provider. This is important. How is this prevented?  Get help if you are struggling with: ? Alcohol or drug use. ? Depression or another mental health problem.  Keep the phone number of your local poison control center near your phone or on your cell phone.  Store all medicines in safety containers that are out of the reach of children.  Read the drug inserts that come with your medicines.  Do not drink alcohol when taking opioids.  Do not use illegal drugs.  Do not take opioid medicines that are not prescribed for you. Contact a health care provider if:  Your symptoms return.  You develop new symptoms or side effects when you are taking medicines. Get help right away if:  You think that you or someone else may have taken too much of an opioid. The hotline of the [facility] is 203-220-5629.  You or someone else is having symptoms of an opioid overdose.  You have serious thoughts about hurting yourself  or others.  You have: ? Chest pain. ? Difficulty breathing. ? A loss of consciousness. Opioid overdose is an emergency. Do not wait to see if the symptoms will go away. Get medical help right away. Call your local emergency services (911 in the [place] Do not drive yourself to the hospital. This information is not intended to replace advice given to you by your health care provider. Make sure you discuss any questions you have with your health care provider. Document Released: 08/16/2004 Document Revised: 01/24/2017 Document Reviewed: 12/23/2014 Elsevier Interactive Patient Education  2019 [employer].

## 2018-12-12 NOTE — Progress Notes (Signed)
Patient: Colleen Sloan Female    DOB: 25-Aug-1970   48 y.o.   MRN: [id number] Visit Date: 12/12/2018  Today's Provider: Lelon Huh, MD   Chief Complaint  Patient presents with  . Follow-up  Virtual Visit via Video Note  I connected with Colleen Sloan on 12/12/18 at  1:40 PM EDT by a video enabled telemedicine application and verified that I am speaking with the correct person using two identifiers.   I discussed the limitations of evaluation and management by telemedicine and the availability of in person appointments. The patient expressed understanding and agreed to proceed.   Subjective:     HPI  Follow up for Anxiety:  The patient was last seen for this more than 1 years ago. Changes made at last visit include none.  She reports good compliance with treatment. She feels that condition is Worse. She is not having side effects.   She states anxiety has been worse since the coronavirus pandemic and taking the alprazolam twice just about every day  She also had foot reconstruction last year by [facility]. Since then she has been on tramadol and oxycodone for pain. She states her foot is now doing a lot better and is working on cutting back on pain medications, however review of PDMP indicates she has been filling both prescriptions consistently every 30 days. She also states she has to take zolpidem every night in order to get to sleep. She states she does have a prescription for Narcan from her orthopedist who is prescribing pain mediations, but she keeps it in a locked safe.  ------------------------------------------------------------------------------------  Allergies  Allergen Reactions  . Augmentin [Amoxicillin-Pot Clavulanate] Anaphylaxis and Swelling     Current Outpatient Medications:  .  albuterol (PROAIR HFA) 108 (90 Base) MCG/ACT inhaler, Inhale 1-2 puffs into the lungs every 6 (six) hours as needed for wheezing or  shortness of breath., Disp: 1 Inhaler, Rfl: 0 .  ALPRAZolam (XANAX) 0.5 MG tablet, TAKE 1 TABLET BY MOUTH  TWICE A DAY AS NEEDED FOR  ANXIETY, Disp: 180 tablet, Rfl: 0 .  Cetirizine HCl (ZYRTEC ALLERGY) 10 MG CAPS, Take 1 capsule (10 mg total) by mouth daily., Disp: 90 capsule, Rfl: 4 .  Cholecalciferol (VITAMIN D PO), Take 2,000 mg by mouth daily. , Disp: , Rfl:  .  cloNIDine (CATAPRES) 0.1 MG tablet, TAKE 1-2 TABLETS BY MOUTH  THREE TIMES A DAY IF NEEDED, Disp: 180 tablet, Rfl: 1 .  fluticasone (FLONASE) 50 MCG/ACT nasal spray, Place 2 sprays into both nostrils daily., Disp: 42 g, Rfl: 4 .  Multiple Vitamin (MULTIVITAMIN) tablet, Take 1 tablet daily by mouth., Disp: , Rfl:  .  venlafaxine XR (EFFEXOR-XR) 75 MG 24 hr capsule, TAKE 1 CAPSULE BY MOUTH  DAILY WITH BREAKFAST, Disp: 90 capsule, Rfl: 4 .  zolpidem (AMBIEN) 10 MG tablet, Take 1 tablet (10 mg total) by mouth at bedtime as needed for sleep., Disp: 90 tablet, Rfl: 1 .  oxyCODONE (OXY IR/ROXICODONE) 5 MG immediate release tablet, Take 1 tablet by mouth 2 (two) times a day., Disp: , Rfl:  .  traMADol (ULTRAM) 50 MG tablet, Take 50 mg by mouth 3 (three) times daily., Disp: , Rfl:   Current Facility-Administered Medications:  .  levonorgestrel (MIRENA) 20 MCG/24HR IUD, , Intrauterine, Once, Terrance Mass, MD  Review of Systems  Constitutional: Negative for appetite change, chills, fatigue and fever.  Respiratory: Negative for chest tightness and shortness of breath.  Cardiovascular: Negative for chest pain and palpitations.  Gastrointestinal: Negative for abdominal pain, nausea and vomiting.  Neurological: Negative for dizziness and weakness.    Social History   Tobacco Use  . Smoking status: Never Smoker  . Smokeless tobacco: Never Used  Substance Use Topics  . Alcohol use: No    Alcohol/week: 0.0 standard drinks      Objective:   There were no vitals taken for this visit.    Physical Exam  General appearance:  alert, well developed, well nourished, cooperative and in no distress Head: Normocephalic, without obvious abnormality, atraumatic Respiratory: Respirations even and unlabored, normal respiratory rate Extremities: No gross deformities Skin: Skin color, texture, turgor normal. No rashes seen  Psych: Appropriate mood and affect. Neurologic: Mental status: Alert, oriented to person, place, and time, thought content appropriate.     Assessment & Plan    1. Acute anxiety Worse recently, will double up on - venlafaxine XR (EFFEXOR-XR) 75 MG 24 hr capsule; Take 2 capsules (150 mg total) by mouth every morning.  Dispense: 3 capsule; Refill: 0 She had 90 days dispensed on 4-24. Will have about a months supply if she take 2 daily. Will call patient in about 2 weeks, if doing better will send new prescription for 178m tablets.   2. Panic disorder Stable, however she was extensively counseled on increased overdose risk taking alprazolam in combination with other CNS depressants. She is going to work on reducing how much she is taking if increased dose of venlafaxine (above) is effective.  - ALPRAZolam (XANAX) 0.5 MG tablet; Take 0.5-1 tablets (0.25-0.5 mg total) by mouth 2 (two) times daily as needed for anxiety.  Dispense: 180 tablet; Refill: 0  3. Chronic foot pain, right S/p ankle surgery last year. Pain medications managed by orthopedist.   4. Uncomplicated opioid dependence ([place]) Counseled extensively regarding risk of taking opioids in combination with other CNS depressants which she expresses understating. She states she is working with orthopedics to wean off of oxycodone. She is going to try reducing alprazolam to 1/2 tablet twice a day. She states she does have Narcan which she keeps in a locked safe. However she was advised that this is not controlled and should be made available quickly and readily in case of suspected overdose of medications, not in a locked safe.     The entirety of the  information documented in the History of Present Illness, Review of Systems and Physical Exam were personally obtained by me. Portions of this information were initially documented by Meyer Cory, CMA and reviewed by me for thoroughness and accuracy.   Lelon Huh, MD  [facility]

## 2018-12-28 ENCOUNTER — Telehealth: Payer: Self-pay | Admitting: Family Medicine

## 2018-12-28 NOTE — Telephone Encounter (Signed)
-----   Message from Birdie Sons, MD sent at 12/12/2018  2:22 PM EDT ----- Regarding: call to see if double dose of venlafaxine is helping. send rx 90 days 150mg  caps to mail order if so

## 2018-12-28 NOTE — Telephone Encounter (Signed)
Patient has dose of Effexor increase to 2 75mg  tablets in May. Please see if this is working better for her. If so will send in new prescription for 150mg  tablets.

## 2018-12-29 MED ORDER — VENLAFAXINE HCL ER 150 MG PO CP24: 150.0000 mg | ORAL_CAPSULE | Freq: Every day | ORAL | 4 refills | Status: DC

## 2018-12-29 NOTE — Telephone Encounter (Signed)
Pt returned call ° °teri °

## 2018-12-29 NOTE — Telephone Encounter (Signed)
I spoke with Colleen Sloan.  She states she is tolerating the increased dose of Effexor well with no side effects.  She states she is still having trouble with anxiety but seems better controlled.  She did mention she was furloughed from her job and that is main cause of her anxiety.    She has also mentioned that she is trying to decrease her Zolpidem to 1/2 tablet at night.  She says she is having to take the second 1/2 about 3-4 times a week if she can't sleep.   Pt states she would like to continue with Effexor 150mg  a day.  Please send refill to [facility].  Thanks,   -Mickel Baas

## 2019-01-29 ENCOUNTER — Other Ambulatory Visit: Payer: Self-pay

## 2019-01-30 ENCOUNTER — Ambulatory Visit (INDEPENDENT_AMBULATORY_CARE_PROVIDER_SITE_OTHER): Payer: Managed Care, Other (non HMO) | Admitting: Gynecology

## 2019-01-30 ENCOUNTER — Encounter: Payer: Self-pay | Admitting: Gynecology

## 2019-01-30 VITALS — BP 124/78 | Ht 65.0 in | Wt 171.0 lb

## 2019-01-30 DIAGNOSIS — Z01419 Encounter for gynecological examination (general) (routine) without abnormal findings: Secondary | ICD-10-CM

## 2019-01-30 DIAGNOSIS — Z1151 Encounter for screening for human papillomavirus (HPV): Secondary | ICD-10-CM | POA: Diagnosis not present

## 2019-01-30 DIAGNOSIS — Z30431 Encounter for routine checking of intrauterine contraceptive device: Secondary | ICD-10-CM | POA: Diagnosis not present

## 2019-01-30 DIAGNOSIS — M2550 Pain in unspecified joint: Secondary | ICD-10-CM

## 2019-01-30 DIAGNOSIS — R42 Dizziness and giddiness: Secondary | ICD-10-CM

## 2019-01-30 NOTE — Addendum Note (Signed)
Addended by: Nelva Nay on: 01/30/2019 03:23 PM   Modules accepted: Orders

## 2019-01-30 NOTE — Progress Notes (Signed)
    Ethleen Lormand Imes 1970-07-30 [id number]        48 y.o.  [id number] for annual gynecologic exam.  Several issues noted below.  Past medical history,surgical history, problem list, medications, allergies, family history and social history were all reviewed and documented as reviewed in the EPIC chart.  ROS:  Performed with pertinent positives and negatives included in the history, assessment and plan.   Additional significant findings : None   Exam: [name] assistant Vitals:   01/30/19 1409  BP: 124/78  Weight: 171 lb (77.6 kg)  Height: 5\' 5"  (1.651 m)   Body mass index is 28.46 kg/m.  General appearance:  Normal affect, orientation and appearance. Skin: Grossly normal HEENT: Without gross lesions.  No cervical or supraclavicular adenopathy. Thyroid normal.  Lungs:  Clear without wheezing, rales or rhonchi Cardiac: RR, without RMG Abdominal:  Soft, nontender, without masses, guarding, rebound, organomegaly or hernia Breasts:  Examined lying and sitting without masses, retractions, discharge or axillary adenopathy. Pelvic:  Ext, BUS, Vagina: Normal  Cervix: Normal.  IUD string visualized.  Pap smear/HPV  Uterus: Anteverted, normal size, shape and contour, midline and mobile nontender   Adnexa: Without masses or tenderness    Anus and perineum: Normal   Rectovaginal: Normal sphincter tone without palpated masses or tenderness.    Assessment/Plan:  48 y.o. [id number] female for annual gynecologic exam.  Without menses, Mirena IUD  1. Mirena IUD 2015.  Due to be replaced now and patient is going to schedule in follow-up for this. 2. Pap smear/HPV 07/2014.  Pap smear/HPV today.  No history of abnormal Pap smears previously. 3. Mammography 2018.  Recommend follow-up mammogram now and patient agrees to call and schedule.  Breast exam normal today. 4. Dizziness, arthralgias.  Patient over the last year or so has been experiencing significant dizzy spells to the point of  where she is fallen over and broken her foot.  Also with significant migratory type arthralgias requiring a cane to walk.  Was evaluated by her primary physician without definitive diagnoses.  Had various blood work studies done.  She asked about who she could see.  Several different possibilities to include possible Lyme's.  Lupus or other connective tissue disorders and primary neurologic such as MS.  Recommend neurology referral and we will go ahead and check baseline labs were not done before to include lupus screen and Lyme's screen.  ANA, lupus anticoagulant, sedimentation rate and Lyme's disease Western blot ordered.   [name] MD, 2:50 PM 01/30/2019

## 2019-01-30 NOTE — Patient Instructions (Addendum)
Schedule your mammogram  Follow-up to have your IUD replaced  If you do not hear from neurology to arrange an appointment over the next 1 to 2 weeks call my office.

## 2019-02-02 ENCOUNTER — Telehealth: Payer: Self-pay | Admitting: *Deleted

## 2019-02-02 ENCOUNTER — Encounter: Payer: Self-pay | Admitting: *Deleted

## 2019-02-02 DIAGNOSIS — R42 Dizziness and giddiness: Secondary | ICD-10-CM

## 2019-02-02 LAB — PAP IG AND HPV HIGH-RISK: HPV DNA High Risk: NOT DETECTED

## 2019-02-02 NOTE — Telephone Encounter (Signed)
Referral placed at [facility]. They will contact her to schedule.

## 2019-02-02 NOTE — Telephone Encounter (Signed)
-----   Message from [name], MD sent at 01/30/2019  2:54 PM EDT ----- [facility] neurology referral reference dizziness with falling over

## 2019-02-03 ENCOUNTER — Other Ambulatory Visit: Payer: Self-pay | Admitting: Family Medicine

## 2019-02-03 NOTE — Telephone Encounter (Signed)
Last mailed 90 days  From [facility] on 12/01/2018. Will not send refill until 02/26/2019

## 2019-02-04 ENCOUNTER — Other Ambulatory Visit: Payer: Self-pay

## 2019-02-04 ENCOUNTER — Telehealth: Payer: Self-pay | Admitting: Neurology

## 2019-02-04 ENCOUNTER — Ambulatory Visit: Payer: Managed Care, Other (non HMO) | Admitting: Neurology

## 2019-02-04 ENCOUNTER — Encounter: Payer: Self-pay | Admitting: Neurology

## 2019-02-04 VITALS — BP 113/75 | HR 58 | Ht 65.0 in | Wt 174.0 lb

## 2019-02-04 DIAGNOSIS — H81399 Other peripheral vertigo, unspecified ear: Secondary | ICD-10-CM | POA: Diagnosis not present

## 2019-02-04 DIAGNOSIS — R42 Dizziness and giddiness: Secondary | ICD-10-CM

## 2019-02-04 DIAGNOSIS — M2550 Pain in unspecified joint: Secondary | ICD-10-CM | POA: Diagnosis not present

## 2019-02-04 NOTE — Progress Notes (Signed)
Subjective:    Patient ID: Colleen Sloan is a 48 y.o. female.  HPI     Colleen Age, MD, PhD [facility] [address], [address] [place], [place] [address]  Dear Dr. Phineas Sloan,  I saw your patient, Colleen Sloan, upon your kind request in my neurologic clinic today for initial consultation of her dizziness.  The patient is unaccompanied today.  As you know, Colleen Sloan is a 48 year old right-handed woman with an underlying medical history of migraines, asthma, depression, arthritis, and overweight state, who reports a approximately 2-year history of dizzy spells.  She feels lightheaded at times, she feels like she may fall after changing positions quickly but it does not feel like a spinning sensation.  In fact, she has had vertigo before but this feels different, no nausea associated with it.  She has some other complaints as well including difficulty concentrating, fatigue, joint pain and various areas of her body, including hands and both knees.  She reports a family history of lupus affecting her sister.  She fell one time, she rolled her ankle over a step and injured her right ankle, needed tendon repair.  She is on narcotic pain medication for residual pain.  She reports nerve damage in that area.  She has a stable area of numbness affecting her right foot and ankle area.  She had left knee surgery several times.  She sees orthopedics for pain management, she is on tramadol and oxycodone.  She reports that her dizzy spells preceded her pain medications.  She tries to hydrate well, she drinks some sweet tea, approximately 2 cups/day, typically no soda or coffee.  She does not drink alcohol or smoke.  She works as a Freight forwarder for [employer]. She has had some blood work through her primary care physician, Dr. Caryn Sloan as well.  I reviewed blood test results from December 2018, at which time she had a normal ESR, normal CK level, normal CRP. She denies  any sustained numbness anywhere else, she denies weakness.  She had a recent eye examination.  She has prescription eyeglasses or wears contacts. I reviewed your office note from 01/30/2019.  She had blood work through your office including ANA, lupus anticoagulant, Lyme disease Western blot, ESR.  Lab results are pending.  Of note, she is on several potentially sedating medications including oxycodone, tramadol, Effexor long-acting, Ambien, Xanax, and clonidine.  She reports a history of intermittent migraines since Sloan 40 or 41.  She recalls that she had a brain MRI at the time.  She has a history of ocular migraines as well with visual field impairment intermittently.  These have been stable and under control, headaches not severe currently.  She has had intermittent other types of headaches that are more behind her eyes, not debilitating, not associated with nausea or vomiting, different from her migraines.  She does not take any additional medications for these as her headaches are not debilitating.  Her Past Medical History Is Significant For: Past Medical History:  Diagnosis Date  . Arthritis    throughout body  . Asthma   . Depression   . Migraines    TAKES TYLOX PRN    Her Past Surgical History Is Significant For: Past Surgical History:  Procedure Laterality Date  . ANKLE SURGERY Right 2003   Reconstruct Ligaments  . ANKLE SURGERY Right 01/29/2018   s/p right ankle revision reconstruction ATFL and CFL 01/29/2018 Dr. Diona Browner [facility]  . COLONOSCOPY WITH PROPOFOL N/A 05/31/2017  Procedure: COLONOSCOPY WITH PROPOFOL;  Surgeon: Lucilla Lame, MD;  Location: [facility];  Service: Endoscopy;  Laterality: N/A;  . DILATION AND CURETTAGE OF UTERUS  1996  . HAND SURGERY  1993  . INTRAUTERINE DEVICE INSERTION  04/02/2014   [place], 2001   X 2 Removal of Cysts  . POLYPECTOMY  05/31/2017   Procedure: POLYPECTOMY;  Surgeon: Lucilla Lame, MD;  Location: [facility];  Service: Endoscopy;;  . Thumb surg      Her Family History Is Significant For: Family History  Problem Relation Sloan of Onset  . Hypertension Father   . Hemachromatosis Father   . Depression Mother   . Anxiety disorder Mother   . Thyroid cancer Mother   . Mental illness Mother   . Bipolar disorder Sister   . Mental illness Sister   . Cervical cancer Sister   . Skin cancer Sister   . Bipolar disorder Maternal Aunt   . Cancer Maternal Aunt        BRAIN  . Other Maternal Grandmother        ANXIETY  . Asthma Maternal Grandmother   . Cancer Paternal Grandmother        LIVER  . Prostate cancer Paternal Grandfather   . Skin cancer Paternal Grandfather   . Breast cancer Neg Hx     Her Social History Is Significant For: Social History   Socioeconomic History  . Marital status: Married    Spouse name: Not on file  . Number of children: Not on file  . Years of education: Not on file  . Highest education level: Not on file  Occupational History  . Not on file  Social Needs  . Financial resource strain: Not on file  . Food insecurity    Worry: Not on file    Inability: Not on file  . Transportation needs    Medical: Not on file    Non-medical: Not on file  Tobacco Use  . Smoking status: Never Smoker  . Smokeless tobacco: Never Used  Substance and Sexual Activity  . Alcohol use: No    Alcohol/week: 0.0 standard drinks  . Drug use: No  . Sexual activity: Yes    Birth control/protection: I.U.D.    Comment: Mirena 04/02/2014-1st intercourse 48 yo.--Fewer than 5 partners  Lifestyle  . Physical activity    Days per week: Not on file    Minutes per session: Not on file  . Stress: Not on file  Relationships  . Social Herbalist on phone: Not on file    Gets together: Not on file    Attends religious service: Not on file    Active member of club or organization: Not on file    Attends meetings of clubs or organizations: Not on file    Relationship  status: Not on file  Other Topics Concern  . Not on file  Social History Narrative  . Not on file    Her Allergies Are:  Allergies  Allergen Reactions  . Augmentin [Amoxicillin-Pot Clavulanate] Anaphylaxis and Swelling  :   Her Current Medications Are:  Outpatient Encounter Medications as of 02/04/2019  Medication Sig  . albuterol (PROAIR HFA) 108 (90 Base) MCG/ACT inhaler Inhale 1-2 puffs into the lungs every 6 (six) hours as needed for wheezing or shortness of breath.  . ALPRAZolam (XANAX) 0.5 MG tablet Take 0.5-1 tablets (0.25-0.5 mg total) by mouth 2 (two) times daily as  needed for anxiety.  . Cetirizine HCl (ZYRTEC ALLERGY) 10 MG CAPS Take 1 capsule (10 mg total) by mouth daily.  . Cholecalciferol (VITAMIN D PO) Take 2,000 mg by mouth daily.   . cloNIDine (CATAPRES) 0.1 MG tablet TAKE 1-2 TABLETS BY MOUTH  THREE TIMES A DAY IF NEEDED  . fluticasone (FLONASE) 50 MCG/ACT nasal spray Place 2 sprays into both nostrils daily.  . Multiple Vitamin (MULTIVITAMIN) tablet Take 1 tablet daily by mouth.  . oxyCODONE (OXY IR/ROXICODONE) 5 MG immediate release tablet Take 1 tablet by mouth 2 (two) times a day.  . traMADol (ULTRAM) 50 MG tablet Take 50 mg by mouth 3 (three) times daily.  Marland Kitchen venlafaxine XR (EFFEXOR XR) 150 MG 24 hr capsule Take 1 capsule (150 mg total) by mouth daily with breakfast.  . zolpidem (AMBIEN) 10 MG tablet Take 0.5-1 tablets (5-10 mg total) by mouth at bedtime as needed for sleep.   Facility-Administered Encounter Medications as of 02/04/2019  Medication  . levonorgestrel (MIRENA) 20 MCG/24HR IUD  :   Review of Systems:  Out of a complete 14 point review of systems, all are reviewed and negative with the exception of these symptoms as listed below:   Review of Systems  Neurological:       Pt presents today to discuss her joint pain, dizziness, and migraines.    Objective:  Neurological Exam  Physical Exam Physical Examination:   Vitals:   02/04/19 1313   BP: 113/75  Pulse: (!) 58   General Examination: The patient is a very pleasant 48 y.o. female in no acute distress. She appears well-developed and well-nourished and well groomed.  On orthostatic testing, she has no significant orthostatic changes, lying blood pressure and pulse is 108/72 with a pulse of 60, sitting 113/75 with a pulse of 58, standing 109/76 with a pulse of 63.  She has no significant orthostatic symptoms, no vertiginous symptoms.  HEENT: Normocephalic, atraumatic, pupils are equal, round and reactive to light and accommodation. Funduscopic exam is normal with sharp disc margins noted. Extraocular tracking is good without limitation to gaze excursion or nystagmus noted. Normal smooth pursuit is noted. Hearing is grossly intact. Face is symmetric with normal facial animation and normal facial sensation. Speech is clear with no dysarthria noted. There is no hypophonia. There is no lip, neck/head, jaw or voice tremor. Neck is supple with full range of passive and active motion. There are no carotid bruits on auscultation. Oropharynx exam reveals: moderate mouth dryness, good dental hygiene. Tongue protrudes centrally and palate elevates symmetrically.   Chest: Clear to auscultation without wheezing, rhonchi or crackles noted.  Heart: S1+S2+0, regular and normal without murmurs, rubs or gallops noted.   Abdomen: Soft, non-tender and non-distended with normal bowel sounds appreciated on auscultation.  Extremities: There is no pitting edema in the distal lower extremities bilaterally. Pedal pulses are intact.  Skin: Warm and dry without trophic changes noted. There are no varicose veins.  Musculoskeletal: exam reveals no obvious joint deformities, tenderness or joint swelling or erythema. She reports pain in her fingers, she has no obvious erythema, or joint swelling.  She has no obvious wrist swelling, unremarkable scar below the right kneecap, unremarkable scar right  ankle.  Neurologically:  Mental status: The patient is awake, alert and oriented in all 4 spheres. Her immediate and remote memory, attention, language skills and fund of knowledge are appropriate. There is no evidence of aphasia, agnosia, apraxia or anomia. Speech is clear with normal prosody and  enunciation. Thought process is linear. Mood is normal and affect is normal.  Cranial nerves II - XII are as described above under HEENT exam. In addition: shoulder shrug is normal with equal shoulder height noted. Motor exam: Normal bulk, strength and tone is noted. There is no drift, tremor or rebound. Romberg is negative, She feels insecure but has no swaying. Reflexes are 2+ throughout. Babinski: Toes are flexor bilaterally. Fine motor skills and coordination: intact with normal finger taps, normal hand movements, normal rapid alternating patting, normal foot taps and normal foot agility.  Cerebellar testing: No dysmetria or intention tremor on finger to nose testing. Heel to shin is unremarkable bilaterally. There is no truncal or gait ataxia.  Sensory exam: intact to light touch, pinprick, vibration, temperature sense in the upper and lower extremities, with the exception of decreased pinprick, temperature and vibration sensation in the right foot, up to around the ankle area, she feels it is stable from before and since her surgery.  Gait, station and balance: She stands easily. No veering to one side is noted. No leaning to one side is noted. Posture is Sloan-appropriate and stance is narrow based. Gait shows normal stride length and normal pace. No problems turning are noted. Tandem walk is unremarkable.   Assessment and Plan:   In summary, Colleen Sloan is a very pleasant 47 y.o.-year old female with an underlying medical history of migraines, asthma, depression, arthritis, and overweight state, who Presents for evaluation of her dizziness.  She Reports a 2-year history of dizziness.  She  also reports that she has not been feeling well in general.  She has had fatigue, difficulty with concentration, joint pain.  She had blood work through her primary care physician and more recently through your office, some results are still pending.  She is advised that we should look at additional blood work in the form of B12, vitamin B-1, vitamin D.  I would like to get these done today.  She is agreeable.  We will call her with her Blood test results.  In addition, I suggested we proceed with a brain MRI with and without contrast.  She reports a longer standing history of migraines but these are currently not frequent, she has had some intermittent milder headaches that are different from her migraines, behind her eyes in particular.  Eye examination and Neurologic examination currently nonfocal with the exception of stable numbness which is reported in her right ankle and foot area.  She is on potentially sedating medications but reports that her dizziness has preceded these medications.  Nevertheless, interaction and potentiation of sedating effects of her medications should be considered as well.  She has been experiencing joint pain in various areas of her joints and is encouraged to talk to her primary care physician about potentially seeing a rheumatologist especially what with her sisters history of lupus.  As I understand, she has seen a hematologist as well because of a family history of hemochromatosis.   I Suggested we proceed with blood test and brain MRI and keep her posted as to the results.  For now, her neurological exam is reassuring.  She will follow-up if needed.  She is encouraged to talk to your office about pending blood tests and talk to primary care physician about potentially seeing a rheumatologist.  She is advised to stay well-hydrated with water and change positions slowly.  She does have a lower blood pressure today but in the normal range, it is possible that  on some days it is  even lower and she may have resultant lightheadedness/dizziness from low blood pressure values as well.  At this juncture, we will be in touch by phone for results and I will see her back as needed.  I answered all her questions today and she was in agreement. Thank you very much for allowing me to participate in the care of this nice patient. If I can be of any further assistance to you please do not hesitate to call me at [phone].  Sincerely,   Colleen Age, MD, PhD

## 2019-02-04 NOTE — Patient Instructions (Addendum)
Unfortunately, dizziness is a very common complaint but is often not due to a primary neurological reason or single underlying medical problem. Often, there a combination of factors, that result in dizziness. These include blood pressure fluctuations, medication side effects, blood sugar fluctuations, stress, vertigo, poor sleep with sleep deprivation, dehydration, and electrolyte disturbance or other metabolic and endocrinological reasons, meaning hormone related problems such as thyroid dysfunction. We will investigate things further with a brain MRI.  I will order labs: B12, vit B1.  We will call you with the test results. For now, I can see you back as needed, so long as your Test results are reassuring as well.  Again, we will call you with the blood work and brain scan results.  You have had some blood work through your GYNs office, please check with their office about test results, I do not see them posted in [place]. Thankfully, you do not have any significant blood pressure drop when changing positions, you do have a lower blood pressure today.  Please try to stay very well-hydrated with water, 6 to 8 cups/day on average.  Please change positions slowly.  Your neurological exam is good, with the exception of numbness in your right foot and ankle area which is not new. You have had joint pain for the past 2 years and what with your family history of lupus, please talk to your primary care physician about seeing a rheumatologist.

## 2019-02-04 NOTE — Telephone Encounter (Signed)
[employer] order sent to [facility]. They obtain the auth and reach out to the patient to schedule.  °

## 2019-02-06 ENCOUNTER — Encounter: Payer: Self-pay | Admitting: Gynecology

## 2019-02-06 ENCOUNTER — Other Ambulatory Visit: Payer: Self-pay | Admitting: Family Medicine

## 2019-02-06 LAB — LYME DISEASE, WESTERN BLOT
IgG P18 Ab.: ABSENT
IgG P23 Ab.: ABSENT
IgG P28 Ab.: ABSENT
IgG P30 Ab.: ABSENT
IgG P39 Ab.: ABSENT
IgG P45 Ab.: ABSENT
IgG P58 Ab.: ABSENT
IgG P66 Ab.: ABSENT
IgG P93 Ab.: ABSENT
IgM P23 Ab.: ABSENT
IgM P39 Ab.: ABSENT
IgM P41 Ab.: ABSENT
Lyme IgG Wb: NEGATIVE
Lyme IgM Wb: NEGATIVE

## 2019-02-06 LAB — ANA: Anti Nuclear Antibody (ANA): NEGATIVE

## 2019-02-06 LAB — SEDIMENTATION RATE: Sed Rate: 3 mm/hr (ref 0–32)

## 2019-02-06 LAB — LUPUS ANTICOAGULANT PANEL
Dilute Viper Venom Time: 29.8 s (ref 0.0–47.0)
PTT Lupus Anticoagulant: 33.1 s (ref 0.0–51.9)

## 2019-02-06 NOTE — Telephone Encounter (Signed)
Patient was seen on 02/04/19 with Colleen Age, MD

## 2019-02-08 ENCOUNTER — Other Ambulatory Visit: Payer: Self-pay | Admitting: Family Medicine

## 2019-02-11 ENCOUNTER — Telehealth: Payer: Self-pay

## 2019-02-11 LAB — VITAMIN B1

## 2019-02-11 NOTE — Progress Notes (Signed)
Please call patient: Labs are fine but for some reason the vitamin B1 level is not back yet.  Vitamin D level was mildly low at 26.5 (normal range is around 30-100). I would recommend that patient start an OTC Vitamin D supplement: 1000-2000 units daily of any vitamin D supplement of Her choice should be fine. I would recommend recheck of vitamin D status in 3-6 months with PCP.  Colleen Sloan

## 2019-02-11 NOTE — Telephone Encounter (Signed)
I called pt, left a detailed message on her cell phone, per DPR, advising of her the lab results and recommendations. I asked her to call back with questions or concerns.

## 2019-02-11 NOTE — Telephone Encounter (Signed)
-----   Message from Star Age, MD sent at 02/11/2019 11:42 AM EDT ----- Please call patient: Labs are fine but for some reason the vitamin B1 level is not back yet.  Vitamin D level was mildly low at 26.5 (normal range is around 30-100). I would recommend that patient start an OTC Vitamin D supplement: 1000-2000 units daily of any vitamin D supplement of Her choice should be fine. I would recommend recheck of vitamin D status in 3-6 months with PCP.  Michel Bickers

## 2019-02-12 ENCOUNTER — Ambulatory Visit: Payer: Managed Care, Other (non HMO) | Admitting: Gynecology

## 2019-02-12 LAB — B12 AND FOLATE PANEL
Folate: 6.1 ng/mL (ref >3.0)
Vitamin B-12: 404 pg/mL (ref 232–1245)

## 2019-02-12 LAB — TSH: TSH: 3.63 u[IU]/mL (ref 0.450–4.500)

## 2019-02-12 LAB — VITAMIN D 25 HYDROXY (VIT D DEFICIENCY, FRACTURES): Vit D, 25-Hydroxy: 26.5 ng/mL — ABNORMAL LOW (ref 30.0–100.0)

## 2019-02-15 ENCOUNTER — Ambulatory Visit
Admission: RE | Admit: 2019-02-15 | Discharge: 2019-02-15 | Disposition: A | Discharge status: Home / Self Care | Payer: Managed Care, Other (non HMO) | Source: Ambulatory Visit | Attending: Neurology | Admitting: Neurology

## 2019-02-15 ENCOUNTER — Other Ambulatory Visit: Payer: Self-pay

## 2019-02-15 DIAGNOSIS — R42 Dizziness and giddiness: Secondary | ICD-10-CM

## 2019-02-15 DIAGNOSIS — H81399 Other peripheral vertigo, unspecified ear: Secondary | ICD-10-CM

## 2019-02-15 DIAGNOSIS — M2550 Pain in unspecified joint: Secondary | ICD-10-CM

## 2019-02-15 MED ORDER — GADOBENATE DIMEGLUMINE 529 MG/ML IV SOLN
15.0000 mL | Freq: Once | INTRAVENOUS | Status: AC | PRN
  Administered 2019-02-15: 15 mL via INTRAVENOUS

## 2019-02-16 ENCOUNTER — Telehealth: Payer: Self-pay

## 2019-02-16 NOTE — Telephone Encounter (Signed)
Our phone system is not working. Will send pt a mychart message with MRI results.

## 2019-02-16 NOTE — Progress Notes (Signed)
Please advise patient that her brain MRI w/wo contrast showed no acute findings and was reported as normal. sa

## 2019-02-16 NOTE — Telephone Encounter (Signed)
-----   Message from Star Age, MD sent at 02/16/2019  8:17 AM EDT ----- Please advise patient that her brain MRI w/wo contrast showed no acute findings and was reported as normal. sa

## 2019-02-16 NOTE — Telephone Encounter (Signed)
Novella Rob: [id number] (exp. 02/11/19 to 08/10/19) patient had MRI at [facility] on 02/15/19.

## 2019-02-22 ENCOUNTER — Other Ambulatory Visit: Payer: Self-pay | Admitting: Family Medicine

## 2019-02-24 IMAGING — CR DG ANKLE COMPLETE 3+V*R*
3 series · 3 of 3 positions shown · non-contrast
Comparison: None.

CLINICAL DATA: Twisting injury with ankle pain, initial encounter

EXAM:
RIGHT ANKLE - COMPLETE 3+ VIEW

[ankle ap]
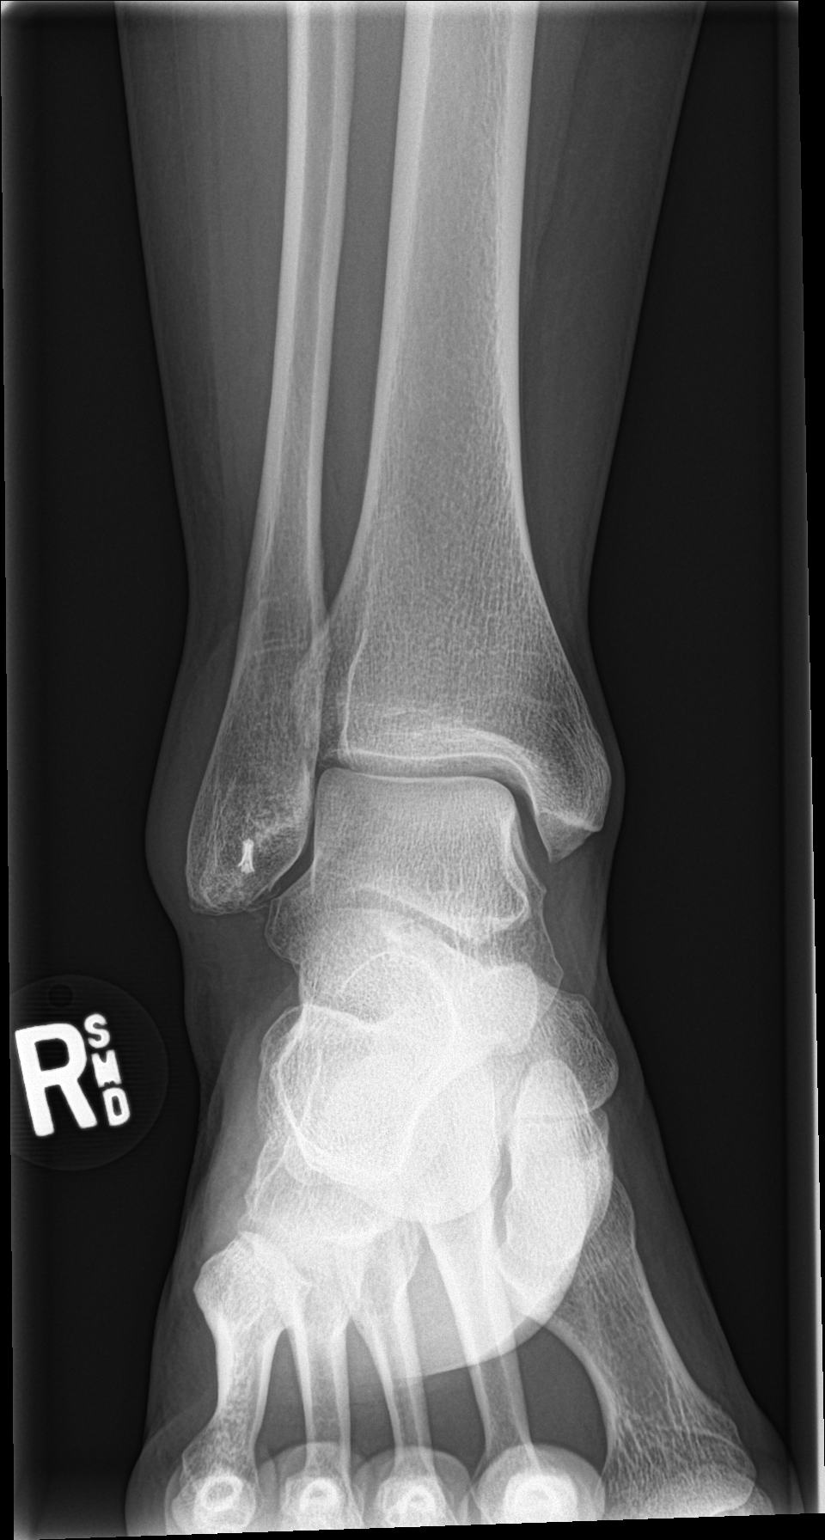

[ankle obl]
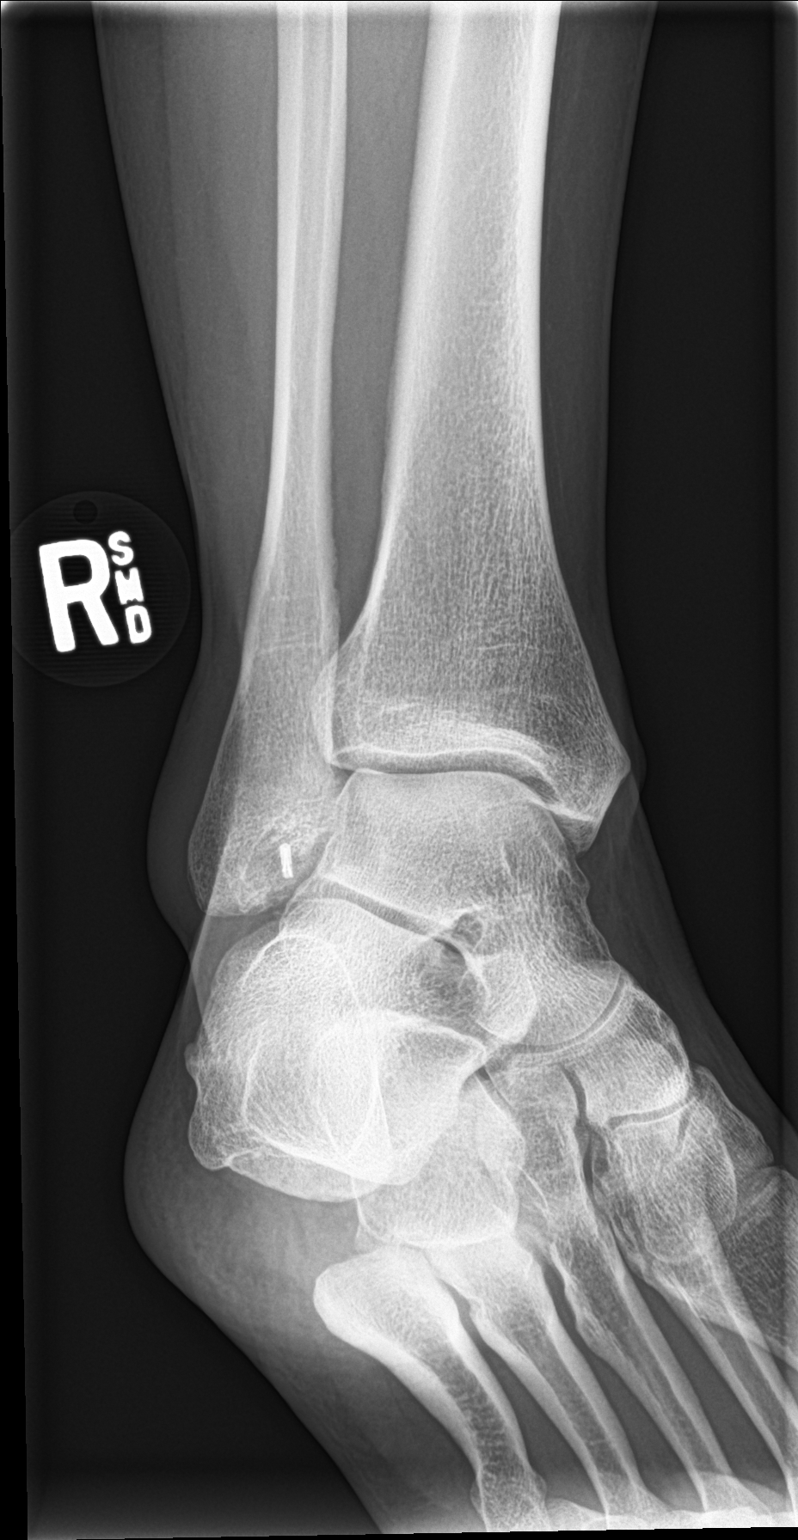

[ankle lat]
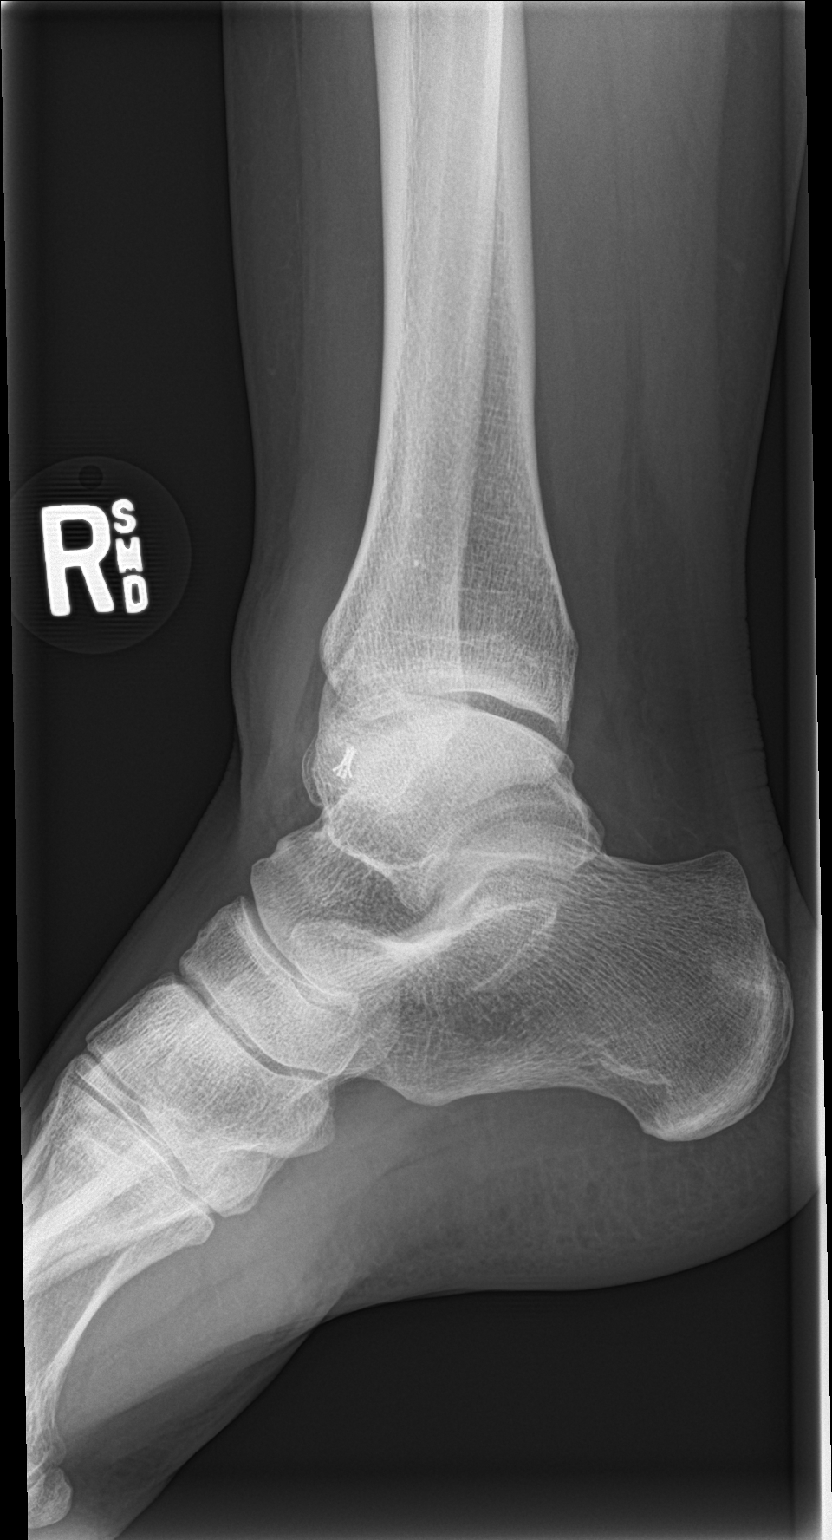

[3 of 3 positions shown; findings below may reference images not displayed]

FINDINGS: Postsurgical changes are noted in the distal fibula. Mild lateral
soft tissue swelling is noted. Some small bony fragments are noted
adjacent to the distal aspect of the fibula which may be related to
prior trauma. No other focal abnormality is noted.
IMPRESSION: Postsurgical changes.

Small bony densities adjacent to the distal aspect of the fibula
which are of uncertain chronicity.

## 2019-02-27 ENCOUNTER — Other Ambulatory Visit: Payer: Self-pay | Admitting: Family Medicine

## 2019-02-27 DIAGNOSIS — F410 Panic disorder [episodic paroxysmal anxiety] without agoraphobia: Secondary | ICD-10-CM

## 2019-02-27 NOTE — Telephone Encounter (Signed)
°  Pt needing the following to be call in:  ALPRAZolam Duanne Moron) 0.5 MG tablet  zolpidem (AMBIEN) 10 MG tablet   Please fill through:  [place], [place] [phone] (Phone) [phone] (Fax)    Thanks, [facility]

## 2019-03-01 MED ORDER — ZOLPIDEM TARTRATE 10 MG PO TABS: 5.0000 mg | ORAL_TABLET | Freq: Every evening | ORAL | 0 refills | Status: DC | PRN

## 2019-03-02 ENCOUNTER — Telehealth: Payer: Self-pay

## 2019-03-02 NOTE — Telephone Encounter (Signed)
Patient reports that her Lorrin Mais is on hold, due to 90 day supply is not specified on prescription. Patient reports that [facility] needs to be called. sd

## 2019-03-05 ENCOUNTER — Other Ambulatory Visit: Payer: Self-pay

## 2019-03-05 DIAGNOSIS — F410 Panic disorder [episodic paroxysmal anxiety] without agoraphobia: Secondary | ICD-10-CM

## 2019-03-05 NOTE — Telephone Encounter (Signed)
Please resend  Thanks,   Mickel Baas

## 2019-03-05 NOTE — Telephone Encounter (Signed)
Patient called stating that [facility] is waiting for authorization for Alprazolam to be filled.

## 2019-03-06 ENCOUNTER — Ambulatory Visit (INDEPENDENT_AMBULATORY_CARE_PROVIDER_SITE_OTHER): Payer: Managed Care, Other (non HMO) | Admitting: Gynecology

## 2019-03-06 ENCOUNTER — Other Ambulatory Visit: Payer: Self-pay

## 2019-03-06 ENCOUNTER — Encounter: Payer: Self-pay | Admitting: Gynecology

## 2019-03-06 VITALS — BP 118/74

## 2019-03-06 DIAGNOSIS — Z30433 Encounter for removal and reinsertion of intrauterine contraceptive device: Secondary | ICD-10-CM

## 2019-03-06 HISTORY — PX: INTRAUTERINE DEVICE INSERTION: SHX323

## 2019-03-06 MED ORDER — ALPRAZOLAM 0.5 MG PO TABS: 0.2500 mg | ORAL_TABLET | Freq: Two times a day (BID) | ORAL | 0 refills | Status: DC | PRN

## 2019-03-06 NOTE — Patient Instructions (Signed)

## 2019-03-06 NOTE — Progress Notes (Signed)
    Colleen Sloan Dec 10, 1970 [id number]        48 y.o.  [id number]  presents for Mirena IUD replacement at her 5-year mark. She has read through the booklet, has no contraindications and signed the consent form.  I reviewed the removal and insertional process with her as well as the risks to include infection, either immediate or long-term, uterine perforation or migration requiring surgery to remove, other complications such as pain, hormonal side effects and possibility of failure with subsequent pregnancy.   Exam with [name] assistant Vitals:   03/06/19 1526  BP: 118/74    Pelvic: External BUS vagina normal. Cervix normal with IUD string visualized. Uterus retroverted normal size shape contour midline mobile nontender. Adnexa without masses or tenderness.  Procedure: The cervix was visualized with a speculum and the old Mirena IUD string was grasped with a [place] forcep, the IUD removed, shown to the patient and discarded.  The cervix was then cleansed with Betadine, anterior lip grasped with a single-tooth tenaculum, the uterus was sounded and a Mirena IUD was placed according to manufacturer's recommendations without difficulty. The strings were trimmed. The patient tolerated well and will follow up in one month for a postinsertional check.  Lot number:  [id number]    [name] MD, 3:46 PM 03/06/2019

## 2019-03-09 ENCOUNTER — Encounter: Payer: Self-pay | Admitting: Gynecology

## 2019-04-06 ENCOUNTER — Ambulatory Visit: Payer: Managed Care, Other (non HMO) | Admitting: Gynecology

## 2019-04-06 DIAGNOSIS — Z0289 Encounter for other administrative examinations: Secondary | ICD-10-CM

## 2019-04-14 ENCOUNTER — Encounter: Payer: Self-pay | Admitting: Gynecology

## 2019-05-21 ENCOUNTER — Other Ambulatory Visit: Payer: Self-pay | Admitting: Family Medicine

## 2019-05-21 DIAGNOSIS — F410 Panic disorder [episodic paroxysmal anxiety] without agoraphobia: Secondary | ICD-10-CM

## 2019-05-22 NOTE — Telephone Encounter (Signed)
Last OV 12/12/2018. Last Rf on Alprazolam 03/06/2019 and Zolpidem was 03/01/2019

## 2019-05-26 ENCOUNTER — Telehealth: Payer: Self-pay

## 2019-05-26 NOTE — Telephone Encounter (Signed)
Patient calling needing refills on the following medications ALPRAZolam (XANAX) 0.5 MG tablet  zolpidem (AMBIEN) 10 MG tablet  Pharmacy: [facility]

## 2019-09-20 ENCOUNTER — Encounter: Payer: Self-pay | Admitting: Family Medicine

## 2019-09-20 DIAGNOSIS — F32A Depression, unspecified: Secondary | ICD-10-CM

## 2019-09-20 DIAGNOSIS — F329 Major depressive disorder, single episode, unspecified: Secondary | ICD-10-CM

## 2019-09-20 DIAGNOSIS — F419 Anxiety disorder, unspecified: Secondary | ICD-10-CM

## 2019-09-29 ENCOUNTER — Telehealth: Payer: Self-pay

## 2019-09-29 MED ORDER — VENLAFAXINE HCL ER 75 MG PO CP24: 75.0000 mg | ORAL_CAPSULE | Freq: Every day | ORAL | 3 refills | Status: DC

## 2019-09-29 MED ORDER — BUPROPION HCL ER (XL) 150 MG PO TB24: 150.0000 mg | ORAL_TABLET | Freq: Every day | ORAL | 1 refills | Status: DC

## 2019-09-29 NOTE — Telephone Encounter (Signed)
Please advise. Patient sent a [facility] message asking the following question.   Do I take the 75 mg Effexor or go back on the 150 mg Effexor?  If I should stay on the 75mg  I am about out and need a refill would you mind calling in a prescription at [facility] and I can pick both the Effexor and Bupropion up at same time. Thank you so much!!!

## 2019-09-29 NOTE — Telephone Encounter (Signed)
Copied from [place] [id number]. Topic: General - Call Back - No Documentation >> Sep 29, 2019  1:05 PM Erick Blinks wrote: Reason for CRM: Pt called in regards to clarification for her medication question on mychart. Please advise

## 2019-09-29 NOTE — Addendum Note (Signed)
Addended by: Birdie Sons on: 09/29/2019 04:19 PM   Modules accepted: Orders

## 2019-10-22 ENCOUNTER — Other Ambulatory Visit: Payer: Self-pay | Admitting: Family Medicine

## 2019-10-22 DIAGNOSIS — F410 Panic disorder [episodic paroxysmal anxiety] without agoraphobia: Secondary | ICD-10-CM

## 2019-10-22 NOTE — Telephone Encounter (Signed)
Requested  medications are  due for refill today yes  Requested medications are on the active medication list yes  Last refill 1/24  Future visit scheduled no  Last visit April 2019  Notes to clinic Not delegated

## 2019-11-10 ENCOUNTER — Other Ambulatory Visit: Payer: Self-pay | Admitting: Family Medicine

## 2019-11-10 DIAGNOSIS — F410 Panic disorder [episodic paroxysmal anxiety] without agoraphobia: Secondary | ICD-10-CM

## 2019-11-10 NOTE — Telephone Encounter (Signed)
ALPRAZolam (XANAX) 0.5 MG tablet  oxyCODONE (OXY IR/ROXICODONE) 5 MG immediate release tablet   [place], [place] [employer] Phone:  [phone]  Fax:  [phone]    Pt states that every year due to the level 1 interaction with these 2 drugs that  [facility] wants a phone call from dr stating that it is ok to continue the refills and the dr is aware that she is still taking both at the level.  The prompt came up when she tried to refill her   ALPRAZolam Duanne Moron) 0.5 MG tablet Wanting office to reach out to [phone]

## 2019-11-17 ENCOUNTER — Other Ambulatory Visit: Payer: Self-pay | Admitting: Family Medicine

## 2019-11-17 DIAGNOSIS — F419 Anxiety disorder, unspecified: Secondary | ICD-10-CM

## 2019-11-17 DIAGNOSIS — F32A Depression, unspecified: Secondary | ICD-10-CM

## 2019-11-17 DIAGNOSIS — F1120 Opioid dependence, uncomplicated: Secondary | ICD-10-CM

## 2019-11-17 DIAGNOSIS — F329 Major depressive disorder, single episode, unspecified: Secondary | ICD-10-CM

## 2019-11-17 NOTE — Telephone Encounter (Signed)
[facility] faxed refill request for the following medications:  cloNIDine (CATAPRES) tablet venlafaxine XR (EFFEXOR XR)  capsule buPROPion (WELLBUTRIN XL) tablet   Please advise.  Thanks, [employer]

## 2019-11-18 MED ORDER — CLONIDINE HCL 0.1 MG PO TABS: ORAL_TABLET | ORAL | 1 refills | Status: DC

## 2019-11-18 MED ORDER — VENLAFAXINE HCL ER 75 MG PO CP24: 75.0000 mg | ORAL_CAPSULE | Freq: Every day | ORAL | 1 refills | Status: DC

## 2019-11-18 MED ORDER — BUPROPION HCL ER (XL) 150 MG PO TB24: 150.0000 mg | ORAL_TABLET | Freq: Every day | ORAL | 1 refills | Status: DC

## 2019-11-18 NOTE — Telephone Encounter (Signed)
Please advise have sent 90 day prescriptions to her mail order. But she is due for follow up appt in office and needs to schedule sometime in the nex 6-8 weeks.

## 2020-01-19 ENCOUNTER — Other Ambulatory Visit: Payer: Self-pay | Admitting: Family Medicine

## 2020-01-19 DIAGNOSIS — F1120 Opioid dependence, uncomplicated: Secondary | ICD-10-CM

## 2020-01-19 NOTE — Telephone Encounter (Signed)
Requested medications are due for refill today? Yes  Requested medications are on active medication list? Yes  Last Refill:  11/18/2019  # 360 with one refill  Future visit scheduled?  No   Notes to Clinic:  Medication failed RX refill protocol due to no valid encounter in the past 6 months.  Patient's last visit was 12/12/2018.

## 2020-02-05 ENCOUNTER — Encounter: Payer: Self-pay | Admitting: *Deleted

## 2020-02-05 ENCOUNTER — Other Ambulatory Visit: Payer: Self-pay

## 2020-02-05 ENCOUNTER — Encounter: Payer: Self-pay | Admitting: Family Medicine

## 2020-02-05 ENCOUNTER — Ambulatory Visit: Payer: Managed Care, Other (non HMO) | Admitting: Family Medicine

## 2020-02-05 VITALS — BP 124/77 | HR 77 | Temp 96.9°F | Resp 16 | Ht 65.0 in | Wt 176.0 lb

## 2020-02-05 DIAGNOSIS — F430 Acute stress reaction: Secondary | ICD-10-CM

## 2020-02-05 DIAGNOSIS — F321 Major depressive disorder, single episode, moderate: Secondary | ICD-10-CM | POA: Diagnosis not present

## 2020-02-05 DIAGNOSIS — F411 Generalized anxiety disorder: Secondary | ICD-10-CM

## 2020-02-05 DIAGNOSIS — F410 Panic disorder [episodic paroxysmal anxiety] without agoraphobia: Secondary | ICD-10-CM | POA: Diagnosis not present

## 2020-02-05 MED ORDER — PAROXETINE HCL 10 MG PO TABS: ORAL_TABLET | ORAL | 1 refills | Status: DC

## 2020-02-05 NOTE — Progress Notes (Signed)
Established patient visit  I,Colleen Sloan,acting as a scribe for Lelon Huh, MD.,have documented all relevant documentation on the behalf of Lelon Huh, MD,as directed by  Lelon Huh, MD while in the presence of Lelon Huh, MD.   Patient: Colleen Sloan   DOB: 1971/07/15   49 y.o. Female  MRN: [id number] Visit Date: 02/05/2020  Today's healthcare provider: Lelon Huh, MD   Chief Complaint  Patient presents with  . Follow-up  . Hypertension   Subjective    HPI   Anxiety, Follow-up  She was last seen for anxiety 12/12/2018. Changes made at last visit include; doubleing up on - venlafaxine XR (EFFEXOR-XR) 75 MG 24 hr capsule  She feels her anxiety is severe and Worse since last visit. She has had a lot of stress at home reporting that her 74 year old daughter is suicidal, and is supposed to be getting in with psychiatrist this week. She is having panic attacks as below. She enjoys her job, but is very stressful having to work when she is concerned about daughter's well being.   Symptoms: No chest pain Yes difficulty concentrating  Yes dizziness Yes fatigue  Yes feelings of losing control Yes insomnia  Yes irritable Yes palpitations  Yes panic attacks Yes racing thoughts  Yes shortness of breath No sweating  No tremors/shakes    GAD-7 Results No flowsheet data found.  PHQ-9 Scores PHQ9 SCORE ONLY 04/01/2017 01/03/2015  PHQ-9 Total Score 4 0    --------------------------------------------------------------------  Panic disorder From 12/12/2018-Stable, however she was extensively counseled on increased overdose risk taking alprazolam in combination with other CNS depressants. She is going to work on reducing how much she is taking if increased dose of venlafaxine (above) is effective. She didn't tolerating 150mg  of Effexor, but bupropion did help a lot with depression.   Patient states she has been having worsening anxiety and depression for past  month. Patient states she has been having increased panic attacks. Patient states she has passed out twice.    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               Needs time off.      Medications: Outpatient Medications Prior to Visit  Medication Sig  . albuterol (PROAIR HFA) 108 (90 Base) MCG/ACT inhaler Inhale 1-2 puffs into the lungs every 6 (six) hours as needed for wheezing or shortness of breath.  . ALPRAZolam (XANAX) 0.5 MG tablet TAKE 1/2 TO 1 TABLET BY  MOUTH TWICE DAILY AS NEEDED FOR ANXIETY  . buPROPion (WELLBUTRIN XL) 150 MG 24 hr tablet Take 1 tablet (150 mg total) by mouth daily.  . Cetirizine HCl (ZYRTEC ALLERGY) 10 MG CAPS Take 1 capsule (10 mg total) by mouth daily.  . Cholecalciferol (VITAMIN D PO) Take 2,000 mg by mouth daily.   . cloNIDine (CATAPRES) 0.1 MG tablet TAKE 1-2 TABLETS BY MOUTH  THREE TIMES A DAY IF NEEDED  . fluticasone (FLONASE) 50 MCG/ACT nasal spray Place 2 sprays into both nostrils daily.  . Multiple Vitamin (MULTIVITAMIN) tablet Take 1 tablet daily by mouth.  . oxyCODONE (OXY IR/ROXICODONE) 5 MG immediate release tablet Take 1 tablet by mouth 2 (two) times a day.  . traMADol (ULTRAM) 50 MG tablet Take 50 mg by mouth 3 (three) times daily.  Marland Kitchen venlafaxine XR (EFFEXOR XR) 75 MG 24 hr capsule Take 1 capsule (75 mg total) by mouth daily with breakfast.  .  zolpidem (AMBIEN) 10 MG tablet TAKE 1/2 TO 1 TABLET BY  MOUTH AT BEDTIME AS NEEDED  FOR SLEEP   Facility-Administered Medications Prior to Visit  Medication Dose Route Frequency Provider  . levonorgestrel (MIRENA) 20 MCG/24HR IUD   Intrauterine Once Terrance Mass, MD    Review of Systems  Constitutional: Negative for appetite change, chills, fatigue and fever.  Respiratory: Negative for chest tightness and shortness of breath.   Cardiovascular: Negative for chest pain and palpitations.  Gastrointestinal: Negative for abdominal pain, nausea and vomiting.  Neurological:  Negative for dizziness and weakness.      Objective    BP 124/77 (BP Location: Left Arm, Patient Position: Sitting, Cuff Size: Large)   Pulse 77   Temp (!) 96.9 F (36.1 C) (Other (Comment))   Resp 16   Ht 5\' 5"  (1.651 m)   Wt 176 lb (79.8 kg)   SpO2 96%   BMI 29.29 kg/m    Physical Exam   General: Appearance:     Overweight female. Is comewhat anxious appearing, tearful at times.   Neurologic:   Awake, alert, oriented x 3. No apparent focal neurological           defect.        No results found for any visits on 02/05/20.  Assessment & Plan     1. Depression, major, single episode, moderate ([place]) She does feels venlafaxine and bupropion have both been helpful,  But anxiety about home situation now leading to worsening depression and panic attacks. Will add- PARoxetine (PAXIL) 10 MG tablet; 1/2 tablet at bedtime for four days, then increase to 1 tablet daily for four days, then increase to 2 tablets daily  Dispense: 30 tablet; Refill: 1  2. Panic attack as reaction to stress add - PARoxetine (PAXIL) 10 MG tablet; 1/2 tablet at bedtime for four days, then increase to 1 tablet daily for four days, then increase to 2 tablets daily  Dispense: 30 tablet; Refill: 1  3. Generalized anxiety disorder Continue current dose of venlafaxine.   Work note for two weeks. She is going to look into FMLA and short term disability.  Return in two weeks.         The entirety of the information documented in the History of Present Illness, Review of Systems and Physical Exam were personally obtained by me. Portions of this information were initially documented by the CMA and reviewed by me for thoroughness and accuracy.      Lelon Huh, MD  [facility] [phone] (phone) [phone] (fax)  [place]

## 2020-02-09 ENCOUNTER — Telehealth: Payer: Self-pay | Admitting: Family Medicine

## 2020-02-09 NOTE — Telephone Encounter (Signed)
[employer] calling to [employer] on if fax received last wk on pt Disability. May call [phone] ref # [id number] can ask for Kelle Darting. Has also refaxed today

## 2020-02-09 NOTE — Telephone Encounter (Signed)
[employer]. Re: fax received form [employer] gr[employer] on pt disability

## 2020-02-09 NOTE — Telephone Encounter (Signed)
We have received this fax and it was printed and sent to Dr. Caryn Section.

## 2020-02-09 NOTE — Telephone Encounter (Signed)
Was it received today or last week?

## 2020-02-11 NOTE — Telephone Encounter (Signed)
Pt called and is requesting to have it extended to 03/07/20 since she has a vacation planned. Please advise .

## 2020-02-11 NOTE — Telephone Encounter (Signed)
Please check with patient and see what days her FMLA needs to cover. We talked about a 2 week leave at her visit, but left it open.

## 2020-02-16 NOTE — Telephone Encounter (Signed)
Forms from [employer] have been completed and fax. Tried to contact pt. No answer. Left voicemail. Thanks TNP

## 2020-02-19 NOTE — Progress Notes (Signed)
I,Rhylie Stehr L [place] Pelto,acting as a scribe for Lelon Huh, MD.,have documented all relevant documentation on the behalf of Lelon Huh, MD,as directed by  Lelon Huh, MD while in the presence of Lelon Huh, MD.  Established patient visit   Patient: Colleen Sloan   DOB: [birth year]   49 y.o. Female  MRN: [id number] Visit Date: 02/22/2020  Today's healthcare provider: Lelon Huh, MD   Chief Complaint  Patient presents with  . Depression  . Anxiety  . Panic Attack   Subjective    HPI  Anxiety/ Depression/ Panic Attacks, Follow-up  She was last seen for anxiety 02/05/2020. Changes made at last visit include adding- PARoxetine (PAXIL) 10 MG tablet; 1/2 tablet at bedtime for four days, then increase to 1 tablet daily for four days, then increase to 2 tablets daily. Patient was to continue Venlafaxine.   She reports good compliance with treatment. She reports good tolerance of treatment. She is not having side effects.   She feels her anxiety is moderate and slightly Improved since last visit. Her last panic attack was 3 days ago.   Symptoms: Yes chest pain Yes difficulty concentrating  Yes dizziness Yes fatigue  Yes feelings of losing control Yes insomnia  Yes irritable Yes palpitations  Yes panic attacks Yes racing thoughts  Yes shortness of breath No sweating  No tremors/shakes    GAD-7 Results GAD-7 Generalized Anxiety Disorder Screening Tool 02/22/2020 02/05/2020  1. Feeling Nervous, Anxious, or on Edge 2 3  2. Not Being Able to Stop or Control Worrying 3 3  3. Worrying Too Much About Different Things 3 3  4. Trouble Relaxing 3 3  5. Being So Restless it's Hard To Sit Still 3 3  6. Becoming Easily Annoyed or Irritable 3 3  7. Feeling Afraid As If Something Awful Might Happen 2 3  Total GAD-7 Score 19 21  Difficulty At Work, Home, or Getting  Along With Others? Extremely difficult Extremely difficult    PHQ-9 Scores PHQ9 SCORE ONLY 02/22/2020  02/05/2020 04/01/2017  PHQ-9 Total Score 24 24 4     ---------------------------------------------------------------------------------------------------     Medications: Outpatient Medications Prior to Visit  Medication Sig  . albuterol (PROAIR HFA) 108 (90 Base) MCG/ACT inhaler Inhale 1-2 puffs into the lungs every 6 (six) hours as needed for wheezing or shortness of breath.  . ALPRAZolam (XANAX) 0.5 MG tablet TAKE 1/2 TO 1 TABLET BY  MOUTH TWICE DAILY AS NEEDED FOR ANXIETY  . buPROPion (WELLBUTRIN XL) 150 MG 24 hr tablet Take 1 tablet (150 mg total) by mouth daily.  . Cetirizine HCl (ZYRTEC ALLERGY) 10 MG CAPS Take 1 capsule (10 mg total) by mouth daily.  . Cholecalciferol (VITAMIN D PO) Take 2,000 mg by mouth daily.   . cloNIDine (CATAPRES) 0.1 MG tablet TAKE 1-2 TABLETS BY MOUTH  THREE TIMES A DAY IF NEEDED  . fluticasone (FLONASE) 50 MCG/ACT nasal spray Place 2 sprays into both nostrils daily.  . Multiple Vitamin (MULTIVITAMIN) tablet Take 1 tablet daily by mouth.  . oxyCODONE (OXY IR/ROXICODONE) 5 MG immediate release tablet Take 1 tablet by mouth 2 (two) times a day.  Marland Kitchen PARoxetine (PAXIL) 10 MG tablet 1/2 tablet at bedtime for four days, then increase to 1 tablet daily for four days, then increase to 2 tablets daily  . traMADol (ULTRAM) 50 MG tablet Take 50 mg by mouth 3 (three) times daily.  Marland Kitchen venlafaxine XR (EFFEXOR XR) 75 MG 24 hr capsule Take 1 capsule (75  mg total) by mouth daily with breakfast.  . zolpidem (AMBIEN) 10 MG tablet TAKE 1/2 TO 1 TABLET BY  MOUTH AT BEDTIME AS NEEDED  FOR SLEEP   Facility-Administered Medications Prior to Visit  Medication Dose Route Frequency Provider  . levonorgestrel (MIRENA) 20 MCG/24HR IUD   Intrauterine Once Terrance Mass, MD       Objective    BP 106/70 (BP Location: Left Arm, Patient Position: Sitting, Cuff Size: Normal)   Pulse 77   Temp 98.3 F (36.8 C) (Temporal)   Resp 16   Wt 175 lb (79.4 kg)   SpO2 99% Comment: room air   BMI 29.12 kg/m    Physical Exam  General appearance:  Overweight female, cooperative and in no acute distress Head: Normocephalic, without obvious abnormality, atraumatic Respiratory: Respirations even and unlabored, normal respiratory rate Extremities: All extremities are intact.  Skin: Skin color, texture, turgor normal. No rashes seen  Psych: Appropriate mood and affect. Neurologic: Mental status: Alert, oriented to person, place, and time, thought content appropriate.   No results found for any visits on 02/22/20.  Assessment & Plan     1. Panic attack as reaction to stress Panic attacks have started improving the last several days since going up to 2 x 10mg  paroxetine. Continue to slowly titrate up to 30 or 40 mg a day.  - PARoxetine (PAXIL) 20 MG tablet; One tablet daily for 1 week, then increase to 1 1/2 tablet daily for two weeks, then 2 tablet daily if needed  Dispense: 30 tablet; Refill: 2  2. Depression, major, single episode, moderate ([facility]) Increase paroxetine as above. Continue low dose venlafaxine and bupropion but advised to let me know if any adverse effects in which case we will reduce venlafaxine. Otherwise follow up about 4 weeks.  - PARoxetine (PAXIL) 20 MG tablet; One tablet daily for 1 week, then increase to 1 1/2 tablet daily for two weeks, then 2 tablet daily if needed  Dispense: 30 tablet; Refill: 2   No follow-ups on file.         Lelon Huh, MD  [facility] [phone] (phone) [phone] (fax)  [place]

## 2020-02-22 ENCOUNTER — Telehealth: Payer: Self-pay

## 2020-02-22 ENCOUNTER — Encounter: Payer: Self-pay | Admitting: Family Medicine

## 2020-02-22 ENCOUNTER — Ambulatory Visit: Payer: Managed Care, Other (non HMO) | Admitting: Family Medicine

## 2020-02-22 ENCOUNTER — Other Ambulatory Visit: Payer: Self-pay

## 2020-02-22 DIAGNOSIS — F321 Major depressive disorder, single episode, moderate: Secondary | ICD-10-CM | POA: Diagnosis not present

## 2020-02-22 DIAGNOSIS — F430 Acute stress reaction: Secondary | ICD-10-CM | POA: Diagnosis not present

## 2020-02-22 DIAGNOSIS — F410 Panic disorder [episodic paroxysmal anxiety] without agoraphobia: Secondary | ICD-10-CM

## 2020-02-22 MED ORDER — PAROXETINE HCL 20 MG PO TABS: ORAL_TABLET | ORAL | 2 refills | Status: DC

## 2020-02-22 NOTE — Telephone Encounter (Signed)
Received forms from [employer] for disability benefits, provider statement. Forms are in Dr. Maralyn Sago box. Please advise. Thanks TNP

## 2020-02-25 ENCOUNTER — Telehealth: Payer: Self-pay

## 2020-02-25 NOTE — Telephone Encounter (Signed)
Copied from [place] #[id number]. Topic: General - Other >> Feb 25, 2020  2:00 PM Mcneil, Ja-Kwan wrote: Reason for CRM: Pt stated she is out of work until 03/08/20 but she would like to request an extension until 03/15/20. Pt requests call back.

## 2020-03-03 ENCOUNTER — Telehealth: Payer: Self-pay

## 2020-03-03 NOTE — Telephone Encounter (Signed)
Copied from [place] [phone]. Topic: General - Other >> Mar 03, 2020 12:53 PM Keene Breath wrote: Reason for CRM: Patient called to obtain a return to work note for 03/08/20. Please advise and call patient to let her know how she can get the note.  CB# [phone]

## 2020-03-07 NOTE — Telephone Encounter (Signed)
That's fine. She can have note to return to work 03-08-2020

## 2020-03-08 NOTE — Telephone Encounter (Signed)
Patient advised that work excuse note would be sent through [employer].

## 2020-03-21 ENCOUNTER — Ambulatory Visit: Payer: Self-pay | Admitting: Family Medicine

## 2020-04-10 ENCOUNTER — Other Ambulatory Visit: Payer: Self-pay | Admitting: Family Medicine

## 2020-04-10 NOTE — Telephone Encounter (Signed)
Requested medication (s) are due for refill today: no  Requested medication (s) are on the active medication list: yes   Last refill:  01/25/2020  Future visit scheduled:no  Notes to clinic:  this refill cannot be delegated    Requested Prescriptions  Pending Prescriptions Disp Refills   zolpidem (AMBIEN) 10 MG tablet [Pharmacy Med Name: ZOLPIDEM  10MG   TAB] 90 tablet     Sig: TAKE 1/2 TO 1 TABLET BY  MOUTH AT BEDTIME AS NEEDED  FOR SLEEP      Not Delegated - Psychiatry:  Anxiolytics/Hypnotics Failed - 04/10/2020 10:04 AM      Failed - This refill cannot be delegated      Failed - Urine Drug Screen completed in last 360 days.      Passed - Valid encounter within last 6 months    Recent Outpatient Visits           1 month ago Panic attack as reaction to [place], MD   2 months ago Depression, major, single episode, moderate [facility])   [facility] Birdie Sons, MD   1 year ago Acute anxiety   [facility] Birdie Sons, MD   2 years ago Mild intermittent reactive airway disease with acute exacerbation   [facility] Mar [place], [place]   2 years ago Acute pansinusitis, recurrence not specified   [facility], [place], [place]

## 2020-04-13 ENCOUNTER — Other Ambulatory Visit: Payer: Self-pay | Admitting: Family Medicine

## 2020-04-13 DIAGNOSIS — F430 Acute stress reaction: Secondary | ICD-10-CM

## 2020-04-13 DIAGNOSIS — F410 Panic disorder [episodic paroxysmal anxiety] without agoraphobia: Secondary | ICD-10-CM

## 2020-04-13 DIAGNOSIS — F321 Major depressive disorder, single episode, moderate: Secondary | ICD-10-CM

## 2020-04-13 NOTE — Telephone Encounter (Signed)
[facility] faxed refill request for the following medications:  PARoxetine (PAXIL) 20 MG tablet  90 day supply Last Rx: 02/22/2020 LOV: 02/22/2020 Please advise. Thanks TNP

## 2020-04-18 ENCOUNTER — Encounter: Payer: Self-pay | Admitting: Family Medicine

## 2020-04-18 MED ORDER — PAROXETINE HCL 40 MG PO TABS: 40.0000 mg | ORAL_TABLET | Freq: Every day | ORAL | 3 refills | Status: DC

## 2020-04-18 NOTE — Telephone Encounter (Signed)
Can you clarify if you want same sig? Or do you want to change instructions to two tablets daily if needed? KW

## 2020-04-18 NOTE — Telephone Encounter (Signed)
Have sent mychart message to patient to see if she is still taking one tablet a day or if she has titrated up to 2.

## 2020-04-23 ENCOUNTER — Other Ambulatory Visit: Payer: Self-pay | Admitting: Family Medicine

## 2020-04-23 DIAGNOSIS — F410 Panic disorder [episodic paroxysmal anxiety] without agoraphobia: Secondary | ICD-10-CM

## 2020-04-23 NOTE — Telephone Encounter (Signed)
Requested medication (s) are due for refill today: yes  Requested medication (s) are on the active medication list: yes  Last refill:  10/25/19  Future visit scheduled: no  Notes to clinic:  med not delegated to NT to RF   Requested Prescriptions  Pending Prescriptions Disp Refills   ALPRAZolam (XANAX) 0.5 MG tablet [Pharmacy Med Name: ALPRAZOLAM  0.5MG   TAB] 180 tablet     Sig: TAKE 1/2 TO 1 TABLET BY  MOUTH TWICE DAILY AS NEEDED FOR ANXIETY      Not Delegated - Psychiatry:  Anxiolytics/Hypnotics Failed - 04/23/2020  3:58 PM      Failed - This refill cannot be delegated      Failed - Urine Drug Screen completed in last 360 days.      Passed - Valid encounter within last 6 months    Recent Outpatient Visits           2 months ago Panic attack as reaction to [place], MD   2 months ago Depression, major, single episode, moderate [facility])   [facility] Birdie Sons, MD   1 year ago Acute anxiety   [facility] Birdie Sons, MD   2 years ago Mild intermittent reactive airway disease with acute exacerbation   [facility] [place], [place]   2 years ago Acute pansinusitis, recurrence not specified   [facility], [place], [place]

## 2020-04-27 ENCOUNTER — Telehealth: Payer: Self-pay | Admitting: Family Medicine

## 2020-04-27 NOTE — Telephone Encounter (Signed)
Copied from [place] [id number]. Topic: General - Other >> Apr 27, 2020  1:24 PM Yvette Rack wrote: Reason for CRM: Leda Gauze with [facility] called for clarification on Rx for ALPRAZolam Colleen Sloan) 0.5 MG tablet. Cb# [id number] Reference# [id number]

## 2020-04-27 NOTE — Telephone Encounter (Signed)
Copied from [place] [id number]. Topic: General - Other >> Apr 27, 2020  1:24 PM Yvette Rack wrote: Reason for CRM: Leda Gauze with [facility] called for clarification on Rx for ALPRAZolam Duanne Moron) 0.5 MG tablet. Cb# [phone] Reference# [id number]  Will route to office for final disposition.

## 2020-05-03 ENCOUNTER — Other Ambulatory Visit: Payer: Self-pay | Admitting: Family Medicine

## 2020-05-03 MED ORDER — ZOLPIDEM TARTRATE 10 MG PO TABS: 5.0000 mg | ORAL_TABLET | Freq: Every evening | ORAL | 1 refills | Status: DC | PRN

## 2020-05-03 NOTE — Addendum Note (Signed)
Addended by: Birdie Sons on: 05/03/2020 08:07 PM   Modules accepted: Orders

## 2020-05-09 ENCOUNTER — Other Ambulatory Visit: Payer: Self-pay | Admitting: Family Medicine

## 2020-05-09 DIAGNOSIS — F410 Panic disorder [episodic paroxysmal anxiety] without agoraphobia: Secondary | ICD-10-CM

## 2020-05-09 MED ORDER — ALPRAZOLAM 0.5 MG PO TABS: 0.2500 mg | ORAL_TABLET | Freq: Two times a day (BID) | ORAL | 3 refills | Status: DC | PRN

## 2020-05-09 NOTE — Telephone Encounter (Signed)
Requested medication (s) are due for refill today: no  Requested medication (s) are on the active medication list: yes  Last refill: 04/24/2020  Future visit scheduled: no  Notes to clinic:  this refill cannot be delegated    Requested Prescriptions  Pending Prescriptions Disp Refills   ALPRAZolam (XANAX) 0.5 MG tablet 180 tablet 3    Sig: Take 0.5-1 tablets (0.25-0.5 mg total) by mouth 2 (two) times daily as needed. for anxiety      Not Delegated - Psychiatry:  Anxiolytics/Hypnotics Failed - 05/09/2020  1:35 PM      Failed - This refill cannot be delegated      Failed - Urine Drug Screen completed in last 360 days.      Passed - Valid encounter within last 6 months    Recent Outpatient Visits           2 months ago Panic attack as reaction to [place], MD   3 months ago Depression, major, single episode, moderate [facility])   [facility] Birdie Sons, MD   1 year ago Acute anxiety   [facility] Birdie Sons, MD   2 years ago Mild intermittent reactive airway disease with acute exacerbation   [facility] Mar [place], [place]   2 years ago Acute pansinusitis, recurrence not specified   [facility], [place], [place]

## 2020-05-09 NOTE — Telephone Encounter (Signed)
Medication Refill - Medication: ALPRAZolam (XANAX) 0.5 MG tablet (Patient is completely out of medication and stated that pharmacy has tried reaching out to office multiple times to get medication prior authoriation complete.)   Has the patient contacted their pharmacy? yes (Agent: If no, request that the patient contact the pharmacy for the refill.) (Agent: If yes, when and what did the pharmacy advise?)Contact pcp  Preferred Pharmacy (with phone number or street name):  [facility], [address], [address] Phone:  [phone]  Fax:  [phone]      Agent: Please be advised that RX refills may take up to 3 business days. We ask that you follow-up with your pharmacy.

## 2020-06-22 ENCOUNTER — Ambulatory Visit (INDEPENDENT_AMBULATORY_CARE_PROVIDER_SITE_OTHER): Payer: Managed Care, Other (non HMO) | Admitting: Adult Health

## 2020-06-22 ENCOUNTER — Encounter: Payer: Self-pay | Admitting: Adult Health

## 2020-06-22 ENCOUNTER — Telehealth: Payer: Self-pay | Admitting: Family Medicine

## 2020-06-22 DIAGNOSIS — U071 COVID-19: Secondary | ICD-10-CM | POA: Insufficient documentation

## 2020-06-22 DIAGNOSIS — J4521 Mild intermittent asthma with (acute) exacerbation: Secondary | ICD-10-CM

## 2020-06-22 DIAGNOSIS — J0140 Acute pansinusitis, unspecified: Secondary | ICD-10-CM | POA: Insufficient documentation

## 2020-06-22 DIAGNOSIS — R062 Wheezing: Secondary | ICD-10-CM | POA: Insufficient documentation

## 2020-06-22 DIAGNOSIS — J40 Bronchitis, not specified as acute or chronic: Secondary | ICD-10-CM

## 2020-06-22 MED ORDER — HYDROCOD POLST-CPM POLST ER 10-8 MG/5ML PO SUER: 5.0000 mL | Freq: Every evening | ORAL | 0 refills | Status: DC | PRN

## 2020-06-22 MED ORDER — ALBUTEROL SULFATE HFA 108 (90 BASE) MCG/ACT IN AERS: 1.0000 | INHALATION_SPRAY | Freq: Four times a day (QID) | RESPIRATORY_TRACT | 1 refills | Status: DC | PRN

## 2020-06-22 MED ORDER — DOXYCYCLINE HYCLATE 100 MG PO TABS: 100.0000 mg | ORAL_TABLET | Freq: Two times a day (BID) | ORAL | 0 refills | Status: DC

## 2020-06-22 MED ORDER — PREDNISONE 10 MG (21) PO TBPK: ORAL_TABLET | ORAL | 0 refills | Status: DC

## 2020-06-22 NOTE — Telephone Encounter (Signed)
Pharmacist and patient advised as below.

## 2020-06-22 NOTE — Telephone Encounter (Signed)
Patient called in to say that the pharmacy is asking for permission from the office to fill the Rx for chlorpheniramine-HYDROcodone [facility]) 10-8 MG/5ML SURE. Per patient she feels really horrible and need this Rx please ASAP

## 2020-06-22 NOTE — Telephone Encounter (Signed)
Kaitlyn pharmacist at [facility] is calling to see if its ok to dispense tussionex  the pt also takes tramadol and oxycodone

## 2020-06-22 NOTE — Progress Notes (Signed)
Virtual Visit via Telephone Note  I connected with Colleen Sloan on 06/23/20 at 10:20 AM EST by telephone and verified that I am speaking with the correct person using two identifiers. Parties involved in visit as below:    Location: Patient: at home  Provider: Provider: Provider's office at  [facility], [place].      I discussed the limitations, risks, security and privacy concerns of performing an evaluation and management service by telephone and the availability of in person appointments. I also discussed with the patient that there may be a patient responsible charge related to this service. The patient expressed understanding and agreed to proceed.   History of Present Illness: She is on day 12 of feeling bad, cough is worsening the past two days. She tried codeine cough syrup  last night to sleep she has a teaspoon left and it helped her sleep. She used Pro air last night for mild wheezing mild shortness shortness of breath and cough. She was also sick 7 days prior with  Sinusitis symptoms that seemed to be relieved she was treated with Doxycyline for 7 days and symptoms resolved for almost two days and then returned, she reports she was diagnosed with Sinusitis at that time.    Patient  denies any fever, body aches,chills, rash, chest pain,nausea, vomiting, or diarrhea.  Denies dizziness, lightheadedness, pre syncopal or syncopal episodes.   Allergies  Allergen Reactions   Augmentin [Amoxicillin-Pot Clavulanate] Anaphylaxis and Swelling    She has non productive, cough. Dry. Sore throat has resolved.  She tested positive for covid 06/12/21. She has had fever.  Today temperature 99.9 with temperature.  Not eating much but soup,. No taste or smell.   Temperature was 103 initially and today better with tylenol.    Denies any chest pain.   Patient  denies any fever, body aches,chills, rash, chest pain,  nausea, vomiting, or diarrhea.  Denies  dizziness, lightheadedness, pre syncopal or syncopal episodes.   Observations/Objective:   Patient is alert and oriented and responsive to questions Engages in conversation with provider. Speaks in full sentences without any pauses without any shortness of breath or distress.   Assessment and Plan:  Acute non-recurrent pansinusitis - Plan: doxycycline (VIBRA-TABS) 100 MG tablet  COVID  Bronchitis due to COVID-19 virus - Plan: predniSONE (STERAPRED UNI-PAK 21 TAB) 10 MG (21) TBPK tablet  Wheezing - Plan: predniSONE (STERAPRED UNI-PAK 21 TAB) 10 MG (21) TBPK tablet, albuterol (PROAIR HFA) 108 (90 Base) MCG/ACT inhaler  Mild intermittent reactive airway disease with acute exacerbation - Plan: predniSONE (STERAPRED UNI-PAK 21 TAB) 10 MG (21) TBPK tablet, chlorpheniramine-HYDROcodone (TUSSIONEX PENNKINETIC ER) 10-8 MG/5ML SUER, albuterol (PROAIR HFA) 108 (90 Base) MCG/ACT inhaler   Home care discussed. As well as symptomatic treatment.    Follow Up Instructions:   I discussed the limitations of evaluation and management by telemedicine and the availability of in person appointments. The patient expressed understanding and agreed to proceed.  I discussed the assessment and treatment plan with the patient. The patient was provided an opportunity to ask questions and all were answered. The patient agreed with the plan and demonstrated an understanding of the instructions.   The patient was advised to call back or seek an in-person evaluation if the symptoms worsen or if the condition fails to improve as anticipated.  I provided 30  minutes of non-face-to-face time during this encounter.   Marcille Buffy, FNP

## 2020-06-22 NOTE — Telephone Encounter (Signed)
Pharmacist called asking if it was ok for them to dispense Tussionex and I read them what Dr. Caryn Section had said in previous answer. cbe

## 2020-06-22 NOTE — Telephone Encounter (Signed)
That's fine, just make sure they remind patient not to take the tussionex within 6 hours of taking oxycodone or tramadol

## 2020-06-23 NOTE — Patient Instructions (Signed)

## 2020-09-04 ENCOUNTER — Other Ambulatory Visit: Payer: Self-pay | Admitting: Family Medicine

## 2020-09-04 DIAGNOSIS — F410 Panic disorder [episodic paroxysmal anxiety] without agoraphobia: Secondary | ICD-10-CM

## 2020-09-04 NOTE — Telephone Encounter (Signed)
Requested medication (s) are due for refill today: no  Requested medication (s) are on the active medication list:  yes  Last refill: 07/31/2020  Future visit scheduled:  no  Notes to clinic:  this refill cannot be delegated    Requested Prescriptions  Pending Prescriptions Disp Refills   ALPRAZolam (XANAX) 0.5 MG tablet [Pharmacy Med Name: ALPRAZolam 0.5 MG Oral Tablet] 180 tablet     Sig: TAKE 1/2 TO 1 TABLET BY  MOUTH TWICE DAILY AS NEEDED FOR ANXIETY      Not Delegated - Psychiatry:  Anxiolytics/Hypnotics Failed - 09/04/2020  8:00 PM      Failed - This refill cannot be delegated      Failed - Urine Drug Screen completed in last 360 days      Failed - Valid encounter within last 6 months    Recent Outpatient Visits           2 months ago Acute non-recurrent pansinusitis   [place] Flinchum, Kelby Aline, FNP   6 months ago Panic attack as reaction to [place], Donald E, MD   7 months ago Depression, major, single episode, moderate [facility])   [facility] Birdie Sons, MD   1 year ago Acute anxiety   [facility] Birdie Sons, MD   2 years ago Mild intermittent reactive airway disease with acute exacerbation   [facility] [place], [place], [place]

## 2020-12-16 ENCOUNTER — Other Ambulatory Visit: Payer: Self-pay | Admitting: Family Medicine

## 2020-12-16 DIAGNOSIS — F5101 Primary insomnia: Secondary | ICD-10-CM

## 2020-12-16 DIAGNOSIS — F419 Anxiety disorder, unspecified: Secondary | ICD-10-CM

## 2020-12-16 DIAGNOSIS — F32A Depression, unspecified: Secondary | ICD-10-CM

## 2020-12-16 NOTE — Telephone Encounter (Signed)
Requested medications are due for refill today yes  Requested medications are on the active medication list yes  Last visit 02/2020  Future visit scheduled no  Notes to clinic Failed protocol of visit within 6 months, Ambien Not Delegated.

## 2020-12-18 NOTE — Telephone Encounter (Signed)
Please advise patient 90 day refills were sent to her mail order pharmacy, but she is due for follow up before next refill. Needs to schedule appt early August for follow up.

## 2020-12-20 NOTE — Telephone Encounter (Signed)
Patient advised. Follow up appointment scheduled for 02/21/2021 at 8:40am.

## 2021-02-08 ENCOUNTER — Telehealth: Payer: Self-pay | Admitting: Family Medicine

## 2021-02-08 DIAGNOSIS — R062 Wheezing: Secondary | ICD-10-CM

## 2021-02-08 DIAGNOSIS — J4521 Mild intermittent asthma with (acute) exacerbation: Secondary | ICD-10-CM

## 2021-02-08 MED ORDER — MOLNUPIRAVIR EUA 200MG CAPSULE: 4.0000 | ORAL_CAPSULE | Freq: Two times a day (BID) | ORAL | 0 refills | Status: AC

## 2021-02-08 MED ORDER — ALBUTEROL SULFATE HFA 108 (90 BASE) MCG/ACT IN AERS: 1.0000 | INHALATION_SPRAY | Freq: Four times a day (QID) | RESPIRATORY_TRACT | 1 refills | Status: DC | PRN

## 2021-02-08 NOTE — Telephone Encounter (Signed)
Pt still has her IUD.  She also needs a refill on her Proair.  (She left it at home.)

## 2021-02-08 NOTE — Telephone Encounter (Signed)
Preferred Pharmacy (with phone number or street name):[id number] - [place], [place] [address] [place] [address] Phone: [phone] Fax: [phone]

## 2021-02-08 NOTE — Telephone Encounter (Signed)
Can send in prescription for molnupiravir, but it can cause birth defects, so it is required to use contraception for 30 days after starting medication. Need to verify that she still has IUD.

## 2021-02-08 NOTE — Telephone Encounter (Signed)
Copied from [place] [phone]. Topic: Quick Communication - Rx Refill/Question >> Feb 08, 2021 10:55 AM Erick Blinks wrote: Medication: Pt called requesting to have the same Rx called in that she was prescribed back in November of 2021. She is currently covid positive with the same symptoms. [place]   Has the patient contacted their pharmacy? No. (Agent: If no, request that the patient contact the pharmacy for the refill.) (Agent: If yes, when and what did the pharmacy advise?)  Preferred Pharmacy (with phone number or street name):[id number] - [place], [place] - [address] [place] [address] Phone: [phone] Fax: [phone]    Agent: Please be advised that RX refills may take up to 3 business days. We ask that you follow-up with your pharmacy.

## 2021-02-08 NOTE — Telephone Encounter (Signed)
Symptoms started 02/07/2021 and tested positive 02/08/2021   Symptoms:   Fever 102 Body aches Fatigue No taste Sore Throat Cough  Headaches Some Shortness of Breath   Pt was prescribed Doxycycline and Prednisone 06/22/2020 (10 days after testing positive for Covid).  She would like to try an antiviral if possible.  I advised pt if her symptoms worsening she would need to go to the ER.  She agreed.  She states she is alone and would call 911.    No Covid Vaccines   Thanks,   -Mickel Baas

## 2021-02-18 ENCOUNTER — Other Ambulatory Visit: Payer: Self-pay | Admitting: Family Medicine

## 2021-02-18 DIAGNOSIS — F32A Depression, unspecified: Secondary | ICD-10-CM

## 2021-02-18 DIAGNOSIS — F419 Anxiety disorder, unspecified: Secondary | ICD-10-CM

## 2021-02-18 NOTE — Telephone Encounter (Signed)
Requested medications are due for refill today yes  Requested medications are on the active medication list yes  Last refill 5/29  Last visit 02/2020  Future visit scheduled 02/2021  Notes to clinic Failed protocol due to no valid visit within 6  months.

## 2021-02-20 ENCOUNTER — Telehealth: Payer: Self-pay

## 2021-02-20 NOTE — Progress Notes (Signed)
MyChart Video Visit    Virtual Visit via Video Note   This visit type was conducted due to national recommendations for restrictions regarding the COVID-19 Pandemic (e.g. social distancing) in an effort to limit this patient's exposure and mitigate transmission in our community. This patient is at least at moderate risk for complications without adequate follow up. This format is felt to be most appropriate for this patient at this time. Physical exam was limited by quality of the video and audio technology used for the visit.   Patient location: home Provider location: [facility]  I discussed the limitations of evaluation and management by telemedicine and the availability of in person appointments. The patient expressed understanding and agreed to proceed.  Patient: Colleen Sloan   DOB: May 18, 1971   50 y.o. Female  MRN: [id number] Visit Date: 02/21/2021  Today's healthcare provider: Lelon Huh, MD   Chief Complaint  Patient presents with   Anxiety   Covid Positive    Subjective    HPI  Anxiety, Follow-up  She was last seen for anxiety 12 months ago. Changes made at last visit include increasing paxil to '40mg'$  daily.   She reports excellent compliance with treatment. She reports excellent tolerance of treatment. She is not having side effects.   She feels her anxiety is mild and Improved since last visit.  Symptoms: No chest pain Yes difficulty concentrating  No dizziness Yes fatigue  No feelings of losing control No insomnia  No irritable No palpitations  No panic attacks No racing thoughts  No shortness of breath No sweating  No tremors/shakes    GAD-7 Results GAD-7 Generalized Anxiety Disorder Screening Tool 02/22/2020 02/05/2020  1. Feeling Nervous, Anxious, or on Edge 2 3  2. Not Being Able to Stop or Control Worrying 3 3  3. Worrying Too Much About Different Things 3 3  4. Trouble Relaxing 3 3  5. Being So Restless it's Hard To Sit Still 3 3  6.  Becoming Easily Annoyed or Irritable 3 3  7. Feeling Afraid As If Something Awful Might Happen 2 3  Total GAD-7 Score 19 21  Difficulty At Work, Home, or Getting  Along With Others? Extremely difficult Extremely difficult    PHQ-9 Scores PHQ9 SCORE ONLY 02/22/2020 02/05/2020 04/01/2017  PHQ-9 Total Score '24 24 4    '$ ---------------------------------------------------------------------------------------------------     Medications: Outpatient Medications Prior to Visit  Medication Sig   albuterol (PROAIR HFA) 108 (90 Base) MCG/ACT inhaler Inhale 1-2 puffs into the lungs every 6 (six) hours as needed for wheezing or shortness of breath.   ALPRAZolam (XANAX) 0.5 MG tablet TAKE 1/2 TO 1 TABLET BY  MOUTH TWICE DAILY AS NEEDED FOR ANXIETY   buPROPion (WELLBUTRIN XL) 150 MG 24 hr tablet Take 1 tablet (150 mg total) by mouth daily.   Cetirizine HCl (ZYRTEC ALLERGY) 10 MG CAPS Take 1 capsule (10 mg total) by mouth daily.   Cholecalciferol (VITAMIN D PO) Take 2,000 mg by mouth daily.    cloNIDine (CATAPRES) 0.1 MG tablet TAKE 1-2 TABLETS BY MOUTH  THREE TIMES A DAY IF NEEDED   fluticasone (FLONASE) 50 MCG/ACT nasal spray Place 2 sprays into both nostrils daily.   Multiple Vitamin (MULTIVITAMIN) tablet Take 1 tablet daily by mouth.   oxyCODONE (OXY IR/ROXICODONE) 5 MG immediate release tablet Take 1 tablet by mouth 2 (two) times a day.   PARoxetine (PAXIL) 40 MG tablet Take 1 tablet (40 mg total) by mouth daily.   traMADol (  ULTRAM) 50 MG tablet Take 50 mg by mouth 3 (three) times daily.   venlafaxine XR (EFFEXOR-XR) 75 MG 24 hr capsule TAKE 1 CAPSULE BY MOUTH  DAILY WITH BREAKFAST   zolpidem (AMBIEN) 10 MG tablet TAKE 1/2 TO 1 TABLET BY  MOUTH AT BEDTIME AS NEEDED  FOR SLEEP   Facility-Administered Medications Prior to Visit  Medication Dose Route Frequency Provider   levonorgestrel (MIRENA) 20 MCG/24HR IUD   Intrauterine Once Terrance Mass, MD    Review of Systems  Constitutional:   Positive for fatigue. Negative for activity change, appetite change, chills, diaphoresis, fever and unexpected weight change.  Respiratory:  Positive for cough, shortness of breath and wheezing. Negative for apnea, choking, chest tightness and stridor.        Tested positive for Covid 02/08/2021.  Pt states she is improving.   Cardiovascular: Negative.   Gastrointestinal: Negative.   Neurological:  Negative for dizziness, light-headedness and headaches.  Psychiatric/Behavioral:  Positive for decreased concentration. Negative for dysphoric mood, self-injury, sleep disturbance and suicidal ideas. The patient is not nervous/anxious.      Objective    There were no vitals taken for this visit.   Physical Exam   Awake, alert, oriented x 3. In no apparent distress    Assessment & Plan     1. Panic disorder Doing well on current medication regiment which she wishes to continue.   2. Depression, major, single episode, moderate (HCC) Continue current medications, current needs refill for - PARoxetine (PAXIL) 40 MG tablet; Take 1 tablet (40 mg total) by mouth daily.  Dispense: 90 tablet; Refill: 3  3. COVID-19 Is recovering well.       I discussed the assessment and treatment plan with the patient. The patient was provided an opportunity to ask questions and all were answered. The patient agreed with the plan and demonstrated an understanding of the instructions.   The patient was advised to call back or seek an in-person evaluation if the symptoms worsen or if the condition fails to improve as anticipated.  I provided 9 minutes of non-face-to-face time during this encounter.  The entirety of the information documented in the History of Present Illness, Review of Systems and Physical Exam were personally obtained by me. Portions of this information were initially documented by the CMA and reviewed by me for thoroughness and accuracy.    Lelon Huh, MD [facility] [phone] (phone) [phone] (fax)  [place]

## 2021-02-20 NOTE — Telephone Encounter (Signed)
Copied from [place] [phone]. Topic: Appointment Scheduling - Scheduling Inquiry for Clinic >> Feb 20, 2021  9:02 AM Greggory Keen D wrote: Reason for CRM: Pt has an appt tomorrow for a medication refill.  She has had covid recently and wants to know if she can do this visit virtually.

## 2021-02-20 NOTE — Telephone Encounter (Signed)
Changed visit to MyChart.  Pt advised.   Thanks,   -Mickel Baas

## 2021-02-21 ENCOUNTER — Ambulatory Visit: Payer: Self-pay | Admitting: Family Medicine

## 2021-02-21 ENCOUNTER — Telehealth (INDEPENDENT_AMBULATORY_CARE_PROVIDER_SITE_OTHER): Payer: Managed Care, Other (non HMO) | Admitting: Family Medicine

## 2021-02-21 DIAGNOSIS — U071 COVID-19: Secondary | ICD-10-CM

## 2021-02-21 DIAGNOSIS — F321 Major depressive disorder, single episode, moderate: Secondary | ICD-10-CM | POA: Diagnosis not present

## 2021-02-21 DIAGNOSIS — F410 Panic disorder [episodic paroxysmal anxiety] without agoraphobia: Secondary | ICD-10-CM | POA: Diagnosis not present

## 2021-02-21 MED ORDER — ALPRAZOLAM 0.5 MG PO TABS: 0.2500 mg | ORAL_TABLET | Freq: Two times a day (BID) | ORAL | 1 refills | Status: DC | PRN

## 2021-02-21 MED ORDER — PAROXETINE HCL 40 MG PO TABS: 40.0000 mg | ORAL_TABLET | Freq: Every day | ORAL | 3 refills | Status: DC

## 2021-03-07 ENCOUNTER — Other Ambulatory Visit: Payer: Self-pay | Admitting: Family Medicine

## 2021-03-07 DIAGNOSIS — F5101 Primary insomnia: Secondary | ICD-10-CM

## 2021-03-07 NOTE — Telephone Encounter (Signed)
Requested medication (s) are due for refill today: no  Requested medication (s) are on the active medication list: yes   Last refill:  12/18/2020  Future visit scheduled:  no  Notes to clinic:  this refill cannot be delegated   Requested Prescriptions  Pending Prescriptions Disp Refills   zolpidem (AMBIEN) 10 MG tablet [Pharmacy Med Name: Zolpidem Tartrate 10 MG Oral Tablet] 90 tablet     Sig: TAKE 1/2 TO 1 TABLET BY  MOUTH AT BEDTIME AS NEEDED  FOR SLEEP     Not Delegated - Psychiatry:  Anxiolytics/Hypnotics Failed - 03/07/2021  1:33 PM      Failed - This refill cannot be delegated      Failed - Urine Drug Screen completed in last 360 days      Passed - Valid encounter within last 6 months    Recent Outpatient Visits           2 weeks ago Panic disorder   [facility] Birdie Sons, MD   8 months ago Acute non-recurrent pansinusitis   [facility] Flinchum, Kelby Aline, FNP   1 year ago Panic attack as reaction to stress   [facility] Birdie Sons, MD   1 year ago Depression, major, single episode, moderate [facility])   [facility] Birdie Sons, MD   2 years ago Acute anxiety   [facility] Birdie Sons, MD

## 2021-03-30 ENCOUNTER — Telehealth: Payer: Self-pay | Admitting: Family Medicine

## 2021-03-30 NOTE — Telephone Encounter (Signed)
Pt calling in regards to medication, Alprazolam, she states that the pharmacy is just needing to verify that the pt is taking this medication with her pain medication, oxycodone, as they have an interaction. Please advise .    [facility]  ([place], [place] [facility]  [address] [place] [address] [place] [address]  Phone: [phone] Fax: [phone]  Hours: Not open 24 hours

## 2021-03-31 NOTE — Telephone Encounter (Signed)
Pt advised.   Thanks,   -Fadumo Heng  

## 2021-03-31 NOTE — Telephone Encounter (Signed)
Pt called to check the status of the message below/ please advise about interaction

## 2021-03-31 NOTE — Telephone Encounter (Signed)
[facility] sent me a form to fill out. It was completed and sent to Medical records to fax today.

## 2021-08-02 ENCOUNTER — Other Ambulatory Visit: Payer: Self-pay | Admitting: Family Medicine

## 2021-08-02 DIAGNOSIS — F1120 Opioid dependence, uncomplicated: Secondary | ICD-10-CM

## 2021-08-02 MED ORDER — CLONIDINE HCL 0.1 MG PO TABS: ORAL_TABLET | ORAL | 1 refills | Status: DC

## 2021-08-02 NOTE — Telephone Encounter (Signed)
LOV:  02/21/2021  NOV:  None  Last Refill:  11/18/2019 #360 1 Refill   Thanks,   Mickel Baas

## 2021-08-02 NOTE — Telephone Encounter (Signed)
[facility] faxed refill request for the following medications:  cloNIDine (CATAPRES) 0.1 MG tablet   Please advise.

## 2021-08-06 ENCOUNTER — Other Ambulatory Visit: Payer: Self-pay | Admitting: Family Medicine

## 2021-08-06 DIAGNOSIS — F5101 Primary insomnia: Secondary | ICD-10-CM

## 2021-08-06 NOTE — Telephone Encounter (Signed)
Requested medication (s) are due for refill today: no  Requested medication (s) are on the active medication list: yes  Last refill:  03/07/21 #90 1 RF  Future visit scheduled: no  Notes to clinic:  med not delegated to NT to RF   Requested Prescriptions  Pending Prescriptions Disp Refills   zolpidem (AMBIEN) 10 MG tablet [Pharmacy Med Name: Zolpidem Tartrate 10 MG Oral Tablet] 90 tablet     Sig: TAKE 1/2 TO 1 TABLET BY  MOUTH AT BEDTIME AS NEEDED  FOR SLEEP     Not Delegated - Psychiatry:  Anxiolytics/Hypnotics Failed - 08/06/2021  1:16 PM      Failed - This refill cannot be delegated      Failed - Urine Drug Screen completed in last 360 days      Passed - Valid encounter within last 6 months    Recent Outpatient Visits           5 months ago Panic disorder   [facility] Birdie Sons, MD   1 year ago Acute non-recurrent pansinusitis   [place] Flinchum, Kelby Aline, FNP   1 year ago Panic attack as reaction to [place], Donald E, MD   1 year ago Depression, major, single episode, moderate [facility])   [facility] Birdie Sons, MD   2 years ago Acute anxiety   [facility] Birdie Sons, MD

## 2021-08-24 ENCOUNTER — Other Ambulatory Visit: Payer: Self-pay | Admitting: Family Medicine

## 2021-08-24 DIAGNOSIS — F410 Panic disorder [episodic paroxysmal anxiety] without agoraphobia: Secondary | ICD-10-CM

## 2021-08-24 NOTE — Telephone Encounter (Signed)
Requested medications are due for refill today.  yes  Requested medications are on the active medications list.  yes  Last refill. 02/21/2021 #180 with 1 refill  Future visit scheduled.   no  Notes to clinic.  Medication not delegated.    Requested Prescriptions  Pending Prescriptions Disp Refills   ALPRAZolam (XANAX) 0.5 MG tablet [Pharmacy Med Name: ALPRAZolam 0.5 MG Oral Tablet] 180 tablet     Sig: TAKE 1/2 TO 1 TABLET BY  MOUTH TWICE DAILY AS NEEDED FOR ANXIETY     Not Delegated - Psychiatry: Anxiolytics/Hypnotics 2 Failed - 08/24/2021  3:09 PM      Failed - This refill cannot be delegated      Failed - Urine Drug Screen completed in last 360 days      Failed - Valid encounter within last 6 months    Recent Outpatient Visits           6 months ago Panic disorder   [facility] Birdie Sons, MD   1 year ago Acute non-recurrent pansinusitis   [place] Flinchum, Kelby Aline, FNP   1 year ago Panic attack as reaction to [place], Donald E, MD   1 year ago Depression, major, single episode, moderate [facility])   [facility] Birdie Sons, MD   2 years ago Acute anxiety   [facility] Birdie Sons, MD              Passed - Patient is not pregnant

## 2021-09-11 ENCOUNTER — Telehealth: Payer: Self-pay | Admitting: Family Medicine

## 2021-09-11 DIAGNOSIS — F410 Panic disorder [episodic paroxysmal anxiety] without agoraphobia: Secondary | ICD-10-CM

## 2021-09-11 NOTE — Telephone Encounter (Signed)
Pt states her medication ALPRAZolam (XANAX) 0.5 MG tablet Is backorder/out of stock at her mail order.   She is asking if Dr Caryn Section will send in a  37 day supply to  [place], [place] does not want to risk running out.

## 2021-09-12 ENCOUNTER — Other Ambulatory Visit: Payer: Self-pay | Admitting: Family Medicine

## 2021-09-12 DIAGNOSIS — F410 Panic disorder [episodic paroxysmal anxiety] without agoraphobia: Secondary | ICD-10-CM

## 2021-09-12 MED ORDER — ALPRAZOLAM 0.5 MG PO TABS: 0.2500 mg | ORAL_TABLET | Freq: Two times a day (BID) | ORAL | 0 refills | Status: DC | PRN

## 2021-09-12 NOTE — Telephone Encounter (Signed)
Can we send in a qty:90 (180)-Take 0.5-1 tablets (0.25-0.5 mg total) by mouth 2 (two) times daily as needed. for anxiety

## 2021-09-12 NOTE — Telephone Encounter (Signed)
Done by provider

## 2021-09-12 NOTE — Telephone Encounter (Signed)
Pt states that this script was not to be for a 90 tablets but for a 90 day supply, recall this script and resubmitt to [facility] at 180 tablets as take s 2 times daily

## 2021-10-07 ENCOUNTER — Other Ambulatory Visit: Payer: Self-pay | Admitting: Family Medicine

## 2021-10-07 DIAGNOSIS — F1120 Opioid dependence, uncomplicated: Secondary | ICD-10-CM

## 2021-10-09 NOTE — Telephone Encounter (Signed)
Pt states she does not need a refill. ?Requested Prescriptions  ?Pending Prescriptions Disp Refills  ?? cloNIDine (CATAPRES) 0.1 MG tablet [Pharmacy Med Name: cloNIDine HCl 0.1 MG Oral Tablet] 540 tablet 3  ?  Sig: TAKE 1 TO 2 TABLETS BY MOUTH 3  TIMES DAILY IF NEEDED  ?  ? Cardiovascular:  Alpha-2 Agonists Failed - 10/09/2021 11:14 AM  ?  ?  Failed - Valid encounter within last 6 months  ?  Recent Outpatient Visits   ?      ? 7 months ago Panic disorder  ? [facility] Caryn Section, Kirstie Peri, MD  ? 1 year ago Acute non-recurrent pansinusitis  ? [place], FNP  ? 1 year ago Panic attack as reaction to stress  ? [facility] Birdie Sons, MD  ? 1 year ago Depression, major, single episode, moderate ([place])  ? [facility] Birdie Sons, MD  ? 2 years ago Acute anxiety  ? [facility] Birdie Sons, MD  ?  ?  ? ?  ?  ?  Passed - Last BP in normal range  ?  BP Readings from Last 1 Encounters:  ?02/22/20 106/70  ?   ?  ?  Passed - Last Heart Rate in normal range  ?  Pulse Readings from Last 1 Encounters:  ?02/22/20 77  ?   ?  ?  ? ?

## 2021-10-09 NOTE — Telephone Encounter (Signed)
Called pt to make follow up appointment. LMOM ?

## 2021-10-09 NOTE — Telephone Encounter (Signed)
Pt is calling back to state that the pharmacy reached out for a refill for this medication. She is not in need of it. And when she is - she will reach out for an apt.  ?

## 2022-01-24 ENCOUNTER — Other Ambulatory Visit: Payer: Self-pay | Admitting: Family Medicine

## 2022-01-24 DIAGNOSIS — F5101 Primary insomnia: Secondary | ICD-10-CM

## 2022-01-24 NOTE — Telephone Encounter (Signed)
Requested medication (s) are due for refill today - yes  Requested medication (s) are on the active medication list -yes  Future visit scheduled -no  Last refill: 08/07/21 #90 1RF  Notes to clinic: non delegated Rx  Requested Prescriptions  Pending Prescriptions Disp Refills   zolpidem (AMBIEN) 10 MG tablet [Pharmacy Med Name: Zolpidem Tartrate 10 MG Oral Tablet] 90 tablet     Sig: TAKE 1/2 TO 1 TABLET BY  MOUTH AT BEDTIME AS NEEDED  FOR SLEEP     Not Delegated - Psychiatry:  Anxiolytics/Hypnotics Failed - 01/24/2022  8:30 AM      Failed - This refill cannot be delegated      Failed - Urine Drug Screen completed in last 360 days      Failed - Valid encounter within last 6 months    Recent Outpatient Visits           11 months ago Panic disorder   [facility] Birdie Sons, MD   1 year ago Acute non-recurrent pansinusitis   [place] Flinchum, Kelby Aline, FNP   1 year ago Panic attack as reaction to [place], Donald E, MD   1 year ago Depression, major, single episode, moderate ([place])   [facility] Birdie Sons, MD   3 years ago Acute anxiety   [facility] Birdie Sons, MD                 Requested Prescriptions  Pending Prescriptions Disp Refills   zolpidem (AMBIEN) 10 MG tablet [Pharmacy Med Name: Zolpidem Tartrate 10 MG Oral Tablet] 90 tablet     Sig: TAKE 1/2 TO 1 TABLET BY  MOUTH AT BEDTIME AS NEEDED  FOR SLEEP     Not Delegated - Psychiatry:  Anxiolytics/Hypnotics Failed - 01/24/2022  8:30 AM      Failed - This refill cannot be delegated      Failed - Urine Drug Screen completed in last 360 days      Failed - Valid encounter within last 6 months    Recent Outpatient Visits           11 months ago Panic disorder   [facility] Birdie Sons, MD   1 year ago Acute non-recurrent pansinusitis   [place] Flinchum,  Kelby Aline, FNP   1 year ago Panic attack as reaction to stress   [facility] Birdie Sons, MD   1 year ago Depression, major, single episode, moderate [facility])   [facility] Birdie Sons, MD   3 years ago Acute anxiety   [place], Kirstie Peri, MD

## 2022-01-24 NOTE — Telephone Encounter (Signed)
Called and advised patient that she is due for follow up appointment. Appointment scheduled for 01/29/2022 at 1:40pm. Patient is requesting a courtesy refill.

## 2022-01-29 ENCOUNTER — Ambulatory Visit: Payer: Managed Care, Other (non HMO) | Admitting: Family Medicine

## 2022-02-19 ENCOUNTER — Ambulatory Visit: Payer: Managed Care, Other (non HMO) | Admitting: Family Medicine

## 2022-03-14 ENCOUNTER — Ambulatory Visit: Payer: Managed Care, Other (non HMO) | Admitting: Family Medicine

## 2022-03-15 ENCOUNTER — Other Ambulatory Visit: Payer: Self-pay | Admitting: Family Medicine

## 2022-03-15 DIAGNOSIS — F410 Panic disorder [episodic paroxysmal anxiety] without agoraphobia: Secondary | ICD-10-CM

## 2022-03-16 ENCOUNTER — Ambulatory Visit: Payer: Managed Care, Other (non HMO) | Admitting: Family Medicine

## 2022-04-18 ENCOUNTER — Encounter: Payer: Self-pay | Admitting: Family Medicine

## 2022-04-18 ENCOUNTER — Ambulatory Visit: Payer: Managed Care, Other (non HMO) | Admitting: Family Medicine

## 2022-04-18 DIAGNOSIS — F32A Depression, unspecified: Secondary | ICD-10-CM | POA: Diagnosis not present

## 2022-04-18 DIAGNOSIS — J301 Allergic rhinitis due to pollen: Secondary | ICD-10-CM | POA: Diagnosis not present

## 2022-04-18 DIAGNOSIS — F419 Anxiety disorder, unspecified: Secondary | ICD-10-CM

## 2022-04-18 MED ORDER — VENLAFAXINE HCL ER 75 MG PO CP24: 75.0000 mg | ORAL_CAPSULE | Freq: Every day | ORAL | 3 refills | Status: DC

## 2022-04-18 MED ORDER — BUPROPION HCL ER (XL) 150 MG PO TB24: 150.0000 mg | ORAL_TABLET | Freq: Every day | ORAL | 1 refills | Status: DC

## 2022-04-18 MED ORDER — FLUTICASONE PROPIONATE 50 MCG/ACT NA SUSP: 2.0000 | Freq: Every day | NASAL | 4 refills | Status: AC

## 2022-04-18 MED ORDER — ZYRTEC ALLERGY 10 MG PO CAPS: 10.0000 mg | ORAL_CAPSULE | Freq: Every day | ORAL | 4 refills | Status: AC

## 2022-04-18 NOTE — Progress Notes (Unsigned)
I,Roshena L Chambers,acting as a scribe for Lelon Huh, MD.,have documented all relevant documentation on the behalf of Lelon Huh, MD,as directed by  Lelon Huh, MD while in the presence of Lelon Huh, MD.   Established patient visit   Patient: Colleen Sloan   DOB: 1971/06/11   51 y.o. Female  MRN: [id number] Visit Date: 04/18/2022  Today's healthcare provider: Lelon Huh, MD   Chief Complaint  Patient presents with   Depression   Subjective    HPI  Depression and panic attacks, Follow-up  She  was last seen for this 1  year  ago. Changes made at last visit include non; continue same medication.   She reports fair compliance with treatment. Does not take Paxil regularly. She is not having side effects.   She reports fair tolerance of treatment. Current symptoms include: depressed mood She feels she is Worse since last visit.     04/18/2022   11:30 AM 02/22/2020   11:14 AM 02/05/2020    3:18 PM  Depression screen PHQ 2/9  Decreased Interest '1 3 3  '$ Down, Depressed, Hopeless '1 3 3  '$ PHQ - 2 Score '2 6 6  '$ Altered sleeping '1 3 3  '$ Tired, decreased energy 0 3 3  Change in appetite 0 3 3  Feeling bad or failure about yourself  '1 3 3  '$ Trouble concentrating '2 3 3  '$ Moving slowly or fidgety/restless '1 3 3  '$ Suicidal thoughts 0 0 0  PHQ-9 Score '7 24 24  '$ Difficult doing work/chores Somewhat difficult Extremely dIfficult Extremely dIfficult    -----------------------------------------------------------------------------------------   Diabetes: Patient states she was diagnosed with diabetes since her last visit. [place] and was seen by a local provider who found her A1C to be 8.2. She was started on Ozempic. She states her most recent A1C was 5.2 in August 2023.   Wt Readings from Last 3 Encounters:  04/18/22 134 lb 9.6 oz (61.1 kg)  02/22/20 175 lb (79.4 kg)  02/05/20 176 lb (79.8 kg)    Medications: Outpatient Medications Prior  to Visit  Medication Sig   ALPRAZolam (XANAX) 0.5 MG tablet TAKE 1/2 TO 1 TABLET BY MOUTH  TWICE DAILY AS NEEDED FOR  ANXIETY   buPROPion (WELLBUTRIN XL) 150 MG 24 hr tablet Take 1 tablet (150 mg total) by mouth daily.   Cetirizine HCl (ZYRTEC ALLERGY) 10 MG CAPS Take 1 capsule (10 mg total) by mouth daily.   Cholecalciferol (VITAMIN D PO) Take 2,000 mg by mouth daily.    cloNIDine (CATAPRES) 0.1 MG tablet TAKE 1-2 TABLETS BY MOUTH  THREE TIMES A DAY IF NEEDED   fluticasone (FLONASE) 50 MCG/ACT nasal spray Place 2 sprays into both nostrils daily.   Multiple Vitamin (MULTIVITAMIN) tablet Take 1 tablet daily by mouth.   Semaglutide, 1 MG/DOSE, (OZEMPIC, 1 MG/DOSE,) 2 MG/1.5ML SOPN Inject into the skin.   traMADol (ULTRAM) 50 MG tablet Take 50 mg by mouth 3 (three) times daily.   venlafaxine XR (EFFEXOR-XR) 75 MG 24 hr capsule TAKE 1 CAPSULE BY MOUTH  DAILY WITH BREAKFAST   zolpidem (AMBIEN) 10 MG tablet TAKE 1/2 TO 1 TABLET BY MOUTH AT BEDTIME AS NEEDED FOR SLEEP   oxyCODONE (OXY IR/ROXICODONE) 5 MG immediate release tablet Take 1 tablet by mouth 2 (two) times a day. (Patient not taking: Reported on 04/18/2022)   PARoxetine (PAXIL) 40 MG tablet Take 1 tablet (40 mg total) by mouth daily. (Patient not taking: Reported  on 04/18/2022)   [DISCONTINUED] albuterol (PROAIR HFA) 108 (90 Base) MCG/ACT inhaler Inhale 1-2 puffs into the lungs every 6 (six) hours as needed for wheezing or shortness of breath. (Patient not taking: Reported on 04/18/2022)   Facility-Administered Medications Prior to Visit  Medication Dose Route Frequency Provider   levonorgestrel (MIRENA) 20 MCG/24HR IUD   Intrauterine Once Terrance Mass, MD    Review of Systems  Constitutional:  Negative for appetite change, chills, fatigue and fever.  Respiratory:  Negative for chest tightness and shortness of breath.   Cardiovascular:  Negative for chest pain and palpitations.  Gastrointestinal:  Negative for abdominal pain, nausea and  vomiting.  Neurological:  Negative for dizziness and weakness.       Objective    BP 114/75 (BP Location: Right Arm, Patient Position: Sitting, Cuff Size: Normal)   Pulse 68   Temp 98.1 F (36.7 C) (Oral)   Resp 12   Wt 134 lb 9.6 oz (61.1 kg)   SpO2 99% Comment: room air  BMI 22.40 kg/m    Physical Exam  General appearance: Well developed, well nourished female, cooperative and in no acute distress Head: Normocephalic, without obvious abnormality, atraumatic Respiratory: Respirations even and unlabored, normal respiratory rate Extremities: All extremities are intact.  Skin: Skin color, texture, turgor normal. No rashes seen  Psych: Appropriate mood and affect. Neurologic: Mental status: Alert, oriented to person, place, and time, thought content appropriate.   Assessment & Plan     1. Allergic rhinitis due to pollen, unspecified seasonality Doing well current treatment. refill fluticasone (FLONASE) 50 MCG/ACT nasal spray; Place 2 sprays into both nostrils daily.  Dispense: 42 g; Refill: 4  2. Depression, unspecified depression type She has been under more stress due to medical conditions of family members, but feels her current medications continue to work well and wishes to continue unchanged. Refill: - venlafaxine XR (EFFEXOR-XR) 75 MG 24 hr capsule; Take 1 capsule (75 mg total) by mouth daily with breakfast.  Dispense: 90 capsule; Refill: 3 - buPROPion (WELLBUTRIN XL) 150 MG 24 hr tablet; Take 1 tablet (150 mg total) by mouth daily.  Dispense: 90 tablet; Refill: 1  3. Acute anxiety continue venlafaxine XR (EFFEXOR-XR) 75 MG 24 hr capsule; Take 1 capsule (75 mg total) by mouth daily with breakfast.  Dispense: 90 capsule; Refill: 3  She is to get medical records from outside clinic regarding A1c and diabetes diagnosis.      The entirety of the information documented in the History of Present Illness, Review of Systems and Physical Exam were personally obtained by me.  Portions of this information were initially documented by the CMA and reviewed by me for thoroughness and accuracy.     Lelon Huh, MD  [facility] [phone] (phone) [phone] (fax)  [place]

## 2022-05-27 ENCOUNTER — Other Ambulatory Visit: Payer: Self-pay | Admitting: Family Medicine

## 2022-05-27 DIAGNOSIS — F410 Panic disorder [episodic paroxysmal anxiety] without agoraphobia: Secondary | ICD-10-CM

## 2022-06-25 ENCOUNTER — Other Ambulatory Visit: Payer: Self-pay | Admitting: Family Medicine

## 2022-06-25 DIAGNOSIS — F5101 Primary insomnia: Secondary | ICD-10-CM

## 2022-06-26 NOTE — Telephone Encounter (Signed)
Requested medication (s) are due for refill today: no  Requested medication (s) are on the active medication list: yes  Last refill:  01/25/22 #90 1 RF  Future visit scheduled: no  Notes to clinic:  this refill not delegated to NT to refill/too soon   Requested Prescriptions  Pending Prescriptions Disp Refills   zolpidem (AMBIEN) 10 MG tablet [Pharmacy Med Name: Zolpidem Tartrate 10 MG Oral Tablet] 90 tablet     Sig: TAKE 1/2 TO 1 TABLET BY MOUTH AT BEDTIME AS NEEDED FOR SLEEP     Not Delegated - Psychiatry:  Anxiolytics/Hypnotics Failed - 06/25/2022 11:21 AM      Failed - This refill cannot be delegated      Failed - Urine Drug Screen completed in last 360 days      Passed - Valid encounter within last 6 months    Recent Outpatient Visits           2 months ago Allergic rhinitis due to pollen, unspecified seasonality   [place], Kirstie Peri, MD   1 year ago Panic disorder   [facility] Birdie Sons, MD   2 years ago Acute non-recurrent pansinusitis   [facility] Flinchum, Kelby Aline, FNP   2 years ago Panic attack as reaction to stress   [facility] Birdie Sons, MD   2 years ago Depression, major, single episode, moderate [facility])   [facility] Caryn Section, Kirstie Peri, MD

## 2022-07-30 ENCOUNTER — Encounter: Payer: Self-pay | Admitting: Family Medicine

## 2022-08-09 ENCOUNTER — Other Ambulatory Visit: Payer: Self-pay | Admitting: Family Medicine

## 2022-08-09 DIAGNOSIS — F410 Panic disorder [episodic paroxysmal anxiety] without agoraphobia: Secondary | ICD-10-CM

## 2022-08-09 NOTE — Telephone Encounter (Signed)
Please review. Last office visit 04/18/2022.  KP

## 2022-11-26 LAB — HM PAP SMEAR

## 2022-11-26 LAB — RESULTS CONSOLE HPV: CHL HPV: NEGATIVE

## 2022-12-04 ENCOUNTER — Other Ambulatory Visit: Payer: Self-pay | Admitting: Family Medicine

## 2022-12-04 DIAGNOSIS — F5101 Primary insomnia: Secondary | ICD-10-CM

## 2022-12-04 NOTE — Telephone Encounter (Signed)
Requested medication (s) are due for refill today: not quite  Requested medication (s) are on the active medication list: yes  Last refill:  06/26/22 #90/1  Future visit scheduled: no  Notes to clinic:  Unable to refill per protocol, cannot delegate.    Requested Prescriptions  Pending Prescriptions Disp Refills   zolpidem (AMBIEN) 10 MG tablet [Pharmacy Med Name: Zolpidem Tartrate 10 MG Oral Tablet] 90 tablet     Sig: TAKE 1/2 TO 1 TABLET BY MOUTH AT BEDTIME AS NEEDED FOR SLEEP     Not Delegated - Psychiatry:  Anxiolytics/Hypnotics Failed - 12/04/2022  9:25 AM      Failed - This refill cannot be delegated      Failed - Urine Drug Screen completed in last 360 days      Failed - Valid encounter within last 6 months    Recent Outpatient Visits           7 months ago Allergic rhinitis due to pollen, unspecified seasonality   [place] [facility] Malva Limes, MD   1 year ago Panic disorder   [place] [facility] Malva Limes, MD   2 years ago Acute non-recurrent pansinusitis   [place] [facility] Flinchum, Eula Fried, FNP   2 years ago Panic attack as reaction to stress   [facility] Malva Limes, MD   2 years ago Depression, major, single episode, moderate [facility])   [place] [facility] Malva Limes, MD

## 2022-12-11 ENCOUNTER — Other Ambulatory Visit: Payer: Self-pay | Admitting: Obstetrics and Gynecology

## 2022-12-11 DIAGNOSIS — Z1231 Encounter for screening mammogram for malignant neoplasm of breast: Secondary | ICD-10-CM

## 2022-12-31 ENCOUNTER — Ambulatory Visit: Payer: Managed Care, Other (non HMO)

## 2023-01-07 ENCOUNTER — Other Ambulatory Visit: Payer: Self-pay | Admitting: Family Medicine

## 2023-01-07 DIAGNOSIS — F410 Panic disorder [episodic paroxysmal anxiety] without agoraphobia: Secondary | ICD-10-CM

## 2023-01-07 NOTE — Telephone Encounter (Signed)
Please advise 

## 2023-01-28 ENCOUNTER — Ambulatory Visit: Payer: Managed Care, Other (non HMO)

## 2023-02-19 ENCOUNTER — Ambulatory Visit: Payer: Self-pay

## 2023-02-19 NOTE — Telephone Encounter (Signed)
Message from [place] P sent at 02/19/2023  8:46 AM EDT  Summary: covid   Pt called saying she tested positive for covid this morning.  She thought she had a sinus infection at first and did a virtual last night and they sent her an antibiotic but she said she wasn't going to use that since she knows it is covid now.  [place] pharmacy is the one listed below.  [facility] [phone]  [address] [facility]  [phone]         Chief Complaint: sinus congestion and face/Paxlovid requested. pain/covid + Symptoms: post nasal drip, chest pressure, muscle aches and teeth pain, fatigue, no appetite or smell, sore throat, face pain, mild SOB, clear nasal drainage Frequency: Thursday  Pertinent Negatives: Patient denies chills Disposition: [] ED /[x] Urgent Care (no appt availability in office) / [] Appointment(In office/virtual)/ []  [place] [facility]/ [] Home Care/ [] Refused Recommended Disposition /[] [facility]/ []  Follow-up with PCP Additional Notes: pt at [facility] [facility]  Reason for Disposition  [1] COVID-19 diagnosed by positive lab test (e.g., PCR, rapid self-test kit) AND [2] mild symptoms (e.g., cough, fever, others) AND [3] no complications or SOB  Answer Assessment - Initial Assessment Questions 1. COVID-19 DIAGNOSIS: "How do you know that you have COVID?" (e.g., positive lab test or self-test, diagnosed by doctor or NP/PA, symptoms after exposure).     Self test  3. ONSET: "When did the COVID-19 symptoms start?"      Thursday  5. COUGH: "Do you have a cough?" If Yes, ask: "How bad is the cough?"       Occasional cough tickle throat 6. FEVER: "Do you have a fever?" If Yes, ask: "What is your temperature, how was it measured, and when did it start?"     no 7. RESPIRATORY STATUS: "Describe your breathing?" (e.g., normal; shortness of breath, wheezing, unable to speak)      none 8. BETTER-SAME-WORSE: "Are you getting better, staying the same or getting worse compared  to yesterday?"  If getting worse, ask, "In what way?"     worse 9. OTHER SYMPTOMS: "Do you have any other symptoms?"  (e.g., chills, fatigue, headache, loss of smell or taste, muscle pain, sore throat)     Sinus pain and congestion, ears stopped up, PND, sore throat, face pain , chest pressure, muscle and teeth pain, fatigue, teeth pain, no appetite, no smell   10. HIGH RISK DISEASE: "Do you have any chronic medical problems?" (e.g., asthma, heart or lung disease, weak immune system, obesity, etc.)       no  Protocols used: Coronavirus (COVID-19) Diagnosed or Suspected-A-AH

## 2023-02-20 ENCOUNTER — Telehealth: Payer: Managed Care, Other (non HMO) | Admitting: Family Medicine

## 2023-03-05 ENCOUNTER — Other Ambulatory Visit: Payer: Self-pay | Admitting: Family Medicine

## 2023-03-05 DIAGNOSIS — F5101 Primary insomnia: Secondary | ICD-10-CM

## 2023-06-17 ENCOUNTER — Telehealth: Payer: Self-pay | Admitting: Family Medicine

## 2023-06-17 DIAGNOSIS — F410 Panic disorder [episodic paroxysmal anxiety] without agoraphobia: Secondary | ICD-10-CM

## 2023-06-17 DIAGNOSIS — F5101 Primary insomnia: Secondary | ICD-10-CM

## 2023-06-19 ENCOUNTER — Other Ambulatory Visit: Payer: Self-pay | Admitting: Family Medicine

## 2023-06-19 DIAGNOSIS — F5101 Primary insomnia: Secondary | ICD-10-CM

## 2023-06-19 DIAGNOSIS — F410 Panic disorder [episodic paroxysmal anxiety] without agoraphobia: Secondary | ICD-10-CM

## 2023-06-19 NOTE — Telephone Encounter (Signed)
Requested medication (s) are due for refill today: no  Requested medication (s) are on the active medication list: yes    Last refill: 06/19/23   90 day supply each med  Future visit scheduled yes 07/15/23  Notes to clinic: Duplicate request. CAnnot refuse non-delegated meds  per protocol.  Requested Prescriptions  Pending Prescriptions Disp Refills   ALPRAZolam (XANAX) 0.5 MG tablet 180 tablet 0    Sig: Take 0.5-1 tablets (0.25-0.5 mg total) by mouth 2 (two) times daily as needed. for anxiety     Not Delegated - Psychiatry: Anxiolytics/Hypnotics 2 Failed - 06/19/2023 12:23 PM      Failed - This refill cannot be delegated      Failed - Urine Drug Screen completed in last 360 days      Failed - Valid encounter within last 6 months    Recent Outpatient Visits           1 year ago Allergic rhinitis due to pollen, unspecified seasonality   [place] [facility] Malva Limes, MD   2 years ago Panic disorder   [place] [facility] Malva Limes, MD   2 years ago Acute non-recurrent pansinusitis   [place] [facility] Flinchum, Eula Fried, FNP   3 years ago Panic attack as reaction to stress   [facility] Malva Limes, MD   3 years ago Depression, major, single episode, moderate (HCC)   [place] [facility] Malva Limes, MD       Future Appointments             In 3 weeks Fisher, Demetrios Isaacs, MD [facility]            Passed - Patient is not pregnant       zolpidem (AMBIEN) 10 MG tablet 90 tablet 0    Sig: Take 0.5-1 tablets (5-10 mg total) by mouth at bedtime as needed. for sleep     Not Delegated - Psychiatry:  Anxiolytics/Hypnotics Failed - 06/19/2023 12:23 PM      Failed - This refill cannot be delegated      Failed - Urine Drug Screen completed in last 360 days      Failed - Valid encounter within last 6 months     Recent Outpatient Visits           1 year ago Allergic rhinitis due to pollen, unspecified seasonality   [place]  [facility] Malva Limes, MD   2 years ago Panic disorder   [place] [facility] Malva Limes, MD   2 years ago Acute non-recurrent pansinusitis   [place] [facility] Flinchum, Eula Fried, FNP   3 years ago Panic attack as reaction to stress   [facility] Malva Limes, MD   3 years ago Depression, major, single episode, moderate (HCC)   [place] [facility] Malva Limes, MD       Future Appointments             In 3 weeks Fisher, Demetrios Isaacs, MD [facility]

## 2023-06-19 NOTE — Telephone Encounter (Signed)
Medication Refill -  Most Recent Primary Care Visit:  Provider: Malva Limes  Department: [facility]  Visit Type: OFFICE VISIT  Date: 04/18/2022  Medication: zolpidem (AMBIEN) 10 MG ALPRAZolam (XANAX) 0.5 MG tablet Pt scheduled follow up with Dr. Sherrie Mustache for 07-15-2023. Pt requesting courtesy refill. Pt states she does need 90 day supply as that is the only way insurance will cover.  Has the patient contacted their pharmacy? Yes Contact office for refills.  Is this the correct pharmacy for this prescription? Yes This is the patient's preferred pharmacy:   [facility]) - [place], [place] - [address] [place] [address] [place] [address] Phone: [phone] Fax: [phone]  Has the prescription been filled recently? No  Is the patient out of the medication? No  Has the patient been seen for an appointment in the last year OR does the patient have an upcoming appointment? Yes  Can we respond through MyChart? Yes  Agent: Please be advised that Rx refills may take up to 3 business days. We ask that you follow-up with your pharmacy.

## 2023-06-19 NOTE — Telephone Encounter (Signed)
I attempted to call patient. No answer.  PEC if she returns call, patient needs follow up before any refills can be approved. Please schedule and route encounter to Dr. Sherrie Mustache for refills. Thank you.

## 2023-06-19 NOTE — Telephone Encounter (Signed)
With Family Medicine Mila Merry, MD) 07/15/2023 at 3:00 PM

## 2023-06-19 NOTE — Telephone Encounter (Signed)
Has not  been seen since sept '23. Needs to schedule o.v. before refills can be approved.

## 2023-07-15 ENCOUNTER — Ambulatory Visit: Payer: Managed Care, Other (non HMO) | Admitting: Family Medicine

## 2023-08-13 ENCOUNTER — Ambulatory Visit: Payer: Managed Care, Other (non HMO) | Admitting: Family Medicine

## 2023-08-14 ENCOUNTER — Ambulatory Visit: Payer: Managed Care, Other (non HMO) | Admitting: Family Medicine

## 2023-08-28 ENCOUNTER — Encounter: Payer: Self-pay | Admitting: Family Medicine

## 2023-08-28 ENCOUNTER — Ambulatory Visit: Payer: Managed Care, Other (non HMO) | Admitting: Family Medicine

## 2023-08-28 VITALS — BP 129/79 | HR 85 | Ht 64.0 in | Wt 138.2 lb

## 2023-08-28 DIAGNOSIS — F5104 Psychophysiologic insomnia: Secondary | ICD-10-CM | POA: Diagnosis not present

## 2023-08-28 DIAGNOSIS — F32A Depression, unspecified: Secondary | ICD-10-CM

## 2023-08-28 DIAGNOSIS — F411 Generalized anxiety disorder: Secondary | ICD-10-CM

## 2023-08-28 DIAGNOSIS — N951 Menopausal and female climacteric states: Secondary | ICD-10-CM

## 2023-08-28 DIAGNOSIS — Z1231 Encounter for screening mammogram for malignant neoplasm of breast: Secondary | ICD-10-CM

## 2023-08-28 DIAGNOSIS — F410 Panic disorder [episodic paroxysmal anxiety] without agoraphobia: Secondary | ICD-10-CM

## 2023-08-28 MED ORDER — VENLAFAXINE HCL ER 150 MG PO CP24: 150.0000 mg | ORAL_CAPSULE | Freq: Every day | ORAL | 1 refills | Status: AC

## 2023-08-28 MED ORDER — ZOLPIDEM TARTRATE 10 MG PO TABS: 5.0000 mg | ORAL_TABLET | Freq: Every evening | ORAL | 1 refills | Status: DC | PRN

## 2023-08-28 MED ORDER — ALPRAZOLAM 0.5 MG PO TABS: 0.2500 mg | ORAL_TABLET | Freq: Two times a day (BID) | ORAL | 1 refills | Status: DC | PRN

## 2023-08-28 MED ORDER — BUPROPION HCL ER (XL) 150 MG PO TB24: 150.0000 mg | ORAL_TABLET | Freq: Every day | ORAL | 2 refills | Status: AC

## 2023-08-28 NOTE — Progress Notes (Signed)
 Established patient visit   Patient: Colleen Sloan   DOB: 1971/06/26   53 y.o. Female  MRN: [id number] Visit Date: 08/28/2023  Today's healthcare provider: Nancyann Perry, MD   Chief Complaint  Patient presents with   Medical Management of Chronic Issues    Patient not sure if she is tolerating Effexor  and Wellbutrin  as well anymore and not sure if it is due to possible menopause   Medication Refill    Patient would like a refill on wellbutrin , effexir, ambien  and xanax    Anxiety    Colleen Sloan is a 53 y.o. female who presents for follow up of anxiety disorder. Current symptoms: chest pain, difficulty concentrating, dizziness, fatigue, insomnia, irritable, racing thoughts, sweating. She denies current suicidal and homicidal ideation.    Insomnia    Tolerating well    Night Sweats    Concerns of menopause X 3 months. Patient uses IUD which stops her menstrual    Immunizations    Shingles -declined Pneumonia - declined Influenza - declined   Subjective    Discussed the use of AI scribe software for clinical note transcription with the patient, who gave verbal consent to proceed.  History of Present Illness   Colleen Sloan is a 53 year old female who presents for follow up of anxiety and mood swings. She has been experiencing anxiety and significant mood swings for the past three months, feeling 'on edge' with severe mood swings. She also has night sweats, which her husband attributes to menopause. Despite these symptoms, she does not feel depressed but describes feeling like she is 'going through the motions.'  She is currently taking Wellbutrin  150 QD and Effexor  (venlafaxine ) 75 QD which has taken for years . She takes 10mg  Ambien  almost every night due to difficulty 'shutting her brain off' and reports that without it, she cannot sleep. Additionally, she uses alprazolam  as needed for panic attacks.  She works from home and feels  fatigued, with a lack of motivation to engage in activities. Despite this, she experiences anxiety when sitting still and feels the need to keep her mind busy.  She previously had concerns about diabetes after an episode of passing out and increased thirst, which led to a high A1c reading. She was prescribed metformin and Ozempic at that time. However, after consulting with an endocrinologist and discontinuing a steroid pack she was on for knee issues, her A1c levels have normalized, and she no longer checks her blood sugar. She has since started Zepbound which she is doing well on.   She also reports hot flashes and sweats and feels like she is going through menopause. She has an IUD and has not had period for years since being on IUD.       Medications: Outpatient Medications Prior to Visit  Medication Sig   Cetirizine  HCl (ZYRTEC  ALLERGY ) 10 MG CAPS Take 1 capsule (10 mg total) by mouth daily.   Cholecalciferol (VITAMIN D  PO) Take 2,000 mg by mouth daily.    cloNIDine  (CATAPRES ) 0.1 MG tablet TAKE 1-2 TABLETS BY MOUTH  THREE TIMES A DAY IF NEEDED   fluticasone  (FLONASE ) 50 MCG/ACT nasal spray Place 2 sprays into both nostrils daily.   Multiple Vitamin (MULTIVITAMIN) tablet Take 1 tablet daily by mouth.   Multiple Vitamins-Minerals (ONE DAILY CALCIUM/IRON) TABS Take by mouth.   traMADol  (ULTRAM ) 50 MG tablet Take 50 mg by mouth 3 (three) times daily.   Vitamin D , Ergocalciferol , (DRISDOL) 1.25 MG (  50000 UNIT) CAPS capsule Vitamin D2   ZEPBOUND 7.5 MG/0.5ML Pen Inject 7.5 mg into the skin once a week.   [DISCONTINUED] ALPRAZolam  (XANAX ) 0.5 MG tablet TAKE 1/2 TO 1 TABLET BY MOUTH  TWICE DAILY AS NEEDED FOR  ANXIETY   [DISCONTINUED] buPROPion  (WELLBUTRIN  XL) 150 MG 24 hr tablet Take 1 tablet (150 mg total) by mouth daily.   [DISCONTINUED] venlafaxine  XR (EFFEXOR -XR) 75 MG 24 hr capsule Take 1 capsule (75 mg total) by mouth daily with breakfast.   [DISCONTINUED] zolpidem  (AMBIEN ) 10 MG tablet  TAKE 1/2 TO 1 TABLET BY MOUTH AT BEDTIME AS NEEDED FOR SLEEP   oxyCODONE  (OXY IR/ROXICODONE ) 5 MG immediate release tablet Take 1 tablet by mouth 2 (two) times a day. (Patient not taking: Reported on 04/18/2022)   Semaglutide, 1 MG/DOSE, (OZEMPIC, 1 MG/DOSE,) 2 MG/1.5ML SOPN Inject into the skin.   Facility-Administered Medications Prior to Visit  Medication Dose Route Frequency Provider   levonorgestrel  (MIRENA ) 20 MCG/24HR IUD   Intrauterine Once Winfred Curlee DEL, MD      Objective    BP 129/79 (BP Location: Left Arm, Patient Position: Sitting, Cuff Size: Normal)   Pulse 85   Ht 5' 4 (1.626 m)   Wt 138 lb 3.2 oz (62.7 kg)   BMI 23.72 kg/m   Physical Exam    General appearance: Well developed, well nourished female, cooperative and in no acute distress Head: Normocephalic, without obvious abnormality, atraumatic Respiratory: Respirations even and unlabored, normal respiratory rate Extremities: All extremities are intact.  Skin: Skin color, texture, turgor normal. No rashes seen  Psych: Appropriate mood and affect. Neurologic: Mental status: Alert, oriented to person, place, and time, thought content appropriate.      Assessment & Plan       Anxiety and Mood Swings Possible perimenopausal symptoms including night sweats, anxiety, and mood swings. Patient is currently on Effexor  and Wellbutrin . No depressive symptoms reported. -Increase Effexor  (Venlafaxine ) to 150mg  daily. -Consider reducing Wellbutrin  if anxiety persists.  Insomnia Difficulty sleeping due to inability to shut brain off. Currently taking Ambien  nightly. -Continue Ambien  as needed. -Consider reducing Ambien  if sleep improves with increased Effexor  dose.  Panic Attacks Managed with Alprazolam  as needed. -Continue Alprazolam  as needed.  General Health Maintenance -Given contact number to schedule mammogram.         Nancyann Perry, MD  [facility] [phone]  (phone) [phone] (fax)  [facility]

## 2023-08-29 ENCOUNTER — Ambulatory Visit
Admission: RE | Admit: 2023-08-29 | Discharge: 2023-08-29 | Disposition: A | Discharge status: Home / Self Care | Payer: Managed Care, Other (non HMO) | Source: Ambulatory Visit | Attending: Family Medicine | Admitting: Family Medicine

## 2023-08-29 DIAGNOSIS — Z1231 Encounter for screening mammogram for malignant neoplasm of breast: Secondary | ICD-10-CM | POA: Insufficient documentation

## 2023-10-28 ENCOUNTER — Telehealth: Payer: Self-pay | Admitting: Family Medicine

## 2023-10-28 DIAGNOSIS — F1120 Opioid dependence, uncomplicated: Secondary | ICD-10-CM

## 2023-10-28 NOTE — Addendum Note (Signed)
 Addended by: Malva Limes on: 10/28/2023 03:54 PM   Modules accepted: Orders

## 2023-10-28 NOTE — Telephone Encounter (Signed)
 [facility] faxed refill request for the following medications:   cloNIDine (CATAPRES) 0.1 MG tablet   Please advise

## 2023-11-15 ENCOUNTER — Other Ambulatory Visit: Payer: Self-pay | Admitting: Family Medicine

## 2023-11-15 DIAGNOSIS — F1120 Opioid dependence, uncomplicated: Secondary | ICD-10-CM

## 2023-11-15 NOTE — Telephone Encounter (Signed)
 [facility] is requesting refill cloNIDine  (CATAPRES ) 0.1 MG tablet  Please advise

## 2023-11-21 MED ORDER — CLONIDINE HCL 0.1 MG PO TABS
ORAL_TABLET | ORAL | 1 refills | Status: DC
Start: 2023-11-21 — End: 2024-02-13

## 2024-02-13 ENCOUNTER — Other Ambulatory Visit: Payer: Self-pay | Admitting: Family Medicine

## 2024-02-13 DIAGNOSIS — F1120 Opioid dependence, uncomplicated: Secondary | ICD-10-CM

## 2024-02-17 ENCOUNTER — Other Ambulatory Visit: Payer: Self-pay | Admitting: Family Medicine

## 2024-02-17 DIAGNOSIS — F410 Panic disorder [episodic paroxysmal anxiety] without agoraphobia: Secondary | ICD-10-CM

## 2024-04-09 ENCOUNTER — Other Ambulatory Visit: Payer: Self-pay | Admitting: Family Medicine

## 2024-04-09 DIAGNOSIS — F5104 Psychophysiologic insomnia: Secondary | ICD-10-CM

## 2024-07-07 ENCOUNTER — Other Ambulatory Visit: Payer: Self-pay | Admitting: Family Medicine

## 2024-07-07 DIAGNOSIS — F411 Generalized anxiety disorder: Secondary | ICD-10-CM

## 2024-07-07 DIAGNOSIS — F32A Depression, unspecified: Secondary | ICD-10-CM

## 2024-07-17 ENCOUNTER — Other Ambulatory Visit: Payer: Self-pay | Admitting: Family Medicine

## 2024-07-17 DIAGNOSIS — F410 Panic disorder [episodic paroxysmal anxiety] without agoraphobia: Secondary | ICD-10-CM

## 2024-07-19 ENCOUNTER — Encounter: Payer: Self-pay | Admitting: Family Medicine

## 2024-08-03 ENCOUNTER — Other Ambulatory Visit: Payer: Self-pay | Admitting: Family Medicine

## 2024-08-03 DIAGNOSIS — F410 Panic disorder [episodic paroxysmal anxiety] without agoraphobia: Secondary | ICD-10-CM

## 2024-08-03 NOTE — Telephone Encounter (Unsigned)
 Copied from CRM [id number]. Topic: Clinical - Medication Refill >> Aug 03, 2024 10:29 AM Laymon HERO wrote: Medication: ALPRAZolam  (XANAX ) 0.5 MG tablet Needing to get  90 day supply due to insurance   Has the patient contacted their pharmacy? Yes (Agent: If no, request that the patient contact the pharmacy for the refill. If patient does not wish to contact the pharmacy document the reason why and proceed with request.) (Agent: If yes, when and what did the pharmacy advise?)  This is the patient's preferred pharmacy:  [facility] - [place], [place] - [address] [place] [address] [place] [address] Phone: [phone] Fax: [phone]   Is this the correct pharmacy for this prescription? Yes If no, delete pharmacy and type the correct one.   Has the prescription been filled recently? Yes  Is the patient out of the medication? Yes  Has the patient been seen for an appointment in the last year OR does the patient have an upcoming appointment? Yes  Can we respond through MyChart? Yes  Agent: Please be advised that Rx refills may take up to 3 business days. We ask that you follow-up with your pharmacy.

## 2024-08-04 NOTE — Telephone Encounter (Signed)
 Requested medication (s) are due for refill today:   Requested medication (s) are on the active medication list: Yes  Last refill:  07/19/24  Future visit scheduled: No  Notes to clinic:  Not delegated.    Requested Prescriptions  Pending Prescriptions Disp Refills   ALPRAZolam  (XANAX ) 0.5 MG tablet 60 tablet 1    Sig: Take 0.5-1 tablets (0.25-0.5 mg total) by mouth 2 (two) times daily as needed. for anxiety     Not Delegated - Psychiatry: Anxiolytics/Hypnotics 2 Failed - 08/04/2024 12:18 PM      Failed - This refill cannot be delegated      Failed - Urine Drug Screen completed in last 360 days      Failed - Valid encounter within last 6 months    Recent Outpatient Visits           11 months ago GAD (generalized anxiety disorder)   [facility] Gasper Nancyann BRAVO, MD              Passed - Patient is not pregnant

## 2024-08-06 MED ORDER — ALPRAZOLAM 0.5 MG PO TABS: 0.2500 mg | ORAL_TABLET | Freq: Two times a day (BID) | ORAL | 0 refills | Status: AC | PRN

## 2024-08-07 ENCOUNTER — Telehealth: Payer: Self-pay

## 2024-08-07 NOTE — Telephone Encounter (Signed)
 Copied from CRM [id number]. Topic: Clinical - Prescription Issue >> Aug 07, 2024  9:12 AM Myrick T wrote: Reason for CRM: patient said for insurance purposes the script for ALPRAZolam  (XANAX ) 0.5 MG tablet needs to be called in to her pharmacy for a 90 day supply. Please advise

## 2024-08-07 NOTE — Telephone Encounter (Signed)
 Courtesy refill was given to pt. Pt was advised to schedule an appt.

## 2024-08-24 ENCOUNTER — Encounter: Admitting: Family Medicine

## 2024-08-24 DIAGNOSIS — Z23 Encounter for immunization: Secondary | ICD-10-CM

## 2024-08-24 DIAGNOSIS — Z1159 Encounter for screening for other viral diseases: Secondary | ICD-10-CM

## 2024-08-24 DIAGNOSIS — F321 Major depressive disorder, single episode, moderate: Secondary | ICD-10-CM

## 2024-08-24 DIAGNOSIS — E559 Vitamin D deficiency, unspecified: Secondary | ICD-10-CM

## 2024-08-24 DIAGNOSIS — Z1211 Encounter for screening for malignant neoplasm of colon: Secondary | ICD-10-CM

## 2024-08-24 DIAGNOSIS — Z0000 Encounter for general adult medical examination without abnormal findings: Secondary | ICD-10-CM

## 2024-08-24 DIAGNOSIS — Z114 Encounter for screening for human immunodeficiency virus [HIV]: Secondary | ICD-10-CM

## 2024-09-14 ENCOUNTER — Encounter: Admitting: Family Medicine
